# Patient Record
Sex: Female | Born: 1940 | Race: Black or African American | Hispanic: No | Smoking: Former smoker
Health system: Southern US, Community
[De-identification: ages and names within clinical notes are randomized; demographics above are authoritative.]

## PROBLEM LIST (undated history)

## (undated) DIAGNOSIS — E119 Type 2 diabetes mellitus without complications: Secondary | ICD-10-CM

## (undated) DIAGNOSIS — I639 Cerebral infarction, unspecified: Secondary | ICD-10-CM

## (undated) DIAGNOSIS — I10 Essential (primary) hypertension: Secondary | ICD-10-CM

## (undated) HISTORY — PX: JOINT REPLACEMENT: SHX530

---

## 2008-06-04 ENCOUNTER — Ambulatory Visit: Payer: Self-pay | Admitting: Orthopedic Surgery

## 2008-06-05 ENCOUNTER — Other Ambulatory Visit: Payer: Self-pay

## 2008-06-05 ENCOUNTER — Inpatient Hospital Stay: Payer: Self-pay | Admitting: Orthopedic Surgery

## 2014-02-26 ENCOUNTER — Inpatient Hospital Stay: Payer: Self-pay | Admitting: Specialist

## 2014-02-26 LAB — URINALYSIS, COMPLETE
BILIRUBIN, UR: NEGATIVE
Glucose,UR: >=500 mg/dL (ref 0–75)
Nitrite: NEGATIVE
PH: 5 (ref 4.5–8.0)
RBC,UR: 12 /HPF (ref 0–5)
Specific Gravity: 1.02 (ref 1.003–1.030)
Squamous Epithelial: <1
WBC UR: 87 /HPF (ref 0–5)

## 2014-02-26 LAB — CBC
HCT: 40.4 % (ref 35.0–47.0)
HGB: 13.4 g/dL (ref 12.0–16.0)
MCH: 30.6 pg (ref 26.0–34.0)
MCHC: 33.1 g/dL (ref 32.0–36.0)
MCV: 92 fL (ref 80–100)
PLATELETS: 136 10*3/uL — AB (ref 150–440)
RBC: 4.37 10*6/uL (ref 3.80–5.20)
RDW: 14.1 % (ref 11.5–14.5)
WBC: 4.2 10*3/uL (ref 3.6–11.0)

## 2014-02-26 LAB — COMPREHENSIVE METABOLIC PANEL
ALK PHOS: 87 U/L
ANION GAP: 7 (ref 7–16)
Albumin: 3.6 g/dL (ref 3.4–5.0)
BUN: 12 mg/dL (ref 7–18)
Bilirubin,Total: 0.3 mg/dL (ref 0.2–1.0)
Calcium, Total: 9 mg/dL (ref 8.5–10.1)
Chloride: 103 mmol/L (ref 98–107)
Co2: 24 mmol/L (ref 21–32)
Creatinine: 1.25 mg/dL (ref 0.60–1.30)
EGFR (African American): 49 — ABNORMAL LOW
GFR CALC NON AF AMER: 42 — AB
Glucose: 317 mg/dL — ABNORMAL HIGH (ref 65–99)
OSMOLALITY: 280 (ref 275–301)
POTASSIUM: 4.1 mmol/L (ref 3.5–5.1)
SGOT(AST): 35 U/L (ref 15–37)
SGPT (ALT): 19 U/L (ref 12–78)
Sodium: 134 mmol/L — ABNORMAL LOW (ref 136–145)
Total Protein: 7.3 g/dL (ref 6.4–8.2)

## 2014-02-26 LAB — PROTIME-INR
INR: 1.1
Prothrombin Time: 14.2 secs (ref 11.5–14.7)

## 2014-02-26 LAB — TROPONIN I: Troponin-I: <0.02 ng/mL

## 2014-02-26 LAB — MAGNESIUM: Magnesium: 1.5 mg/dL — ABNORMAL LOW

## 2014-02-26 IMAGING — CR DG CHEST 1V PORT
1 series · 1 of 1 positions shown · non-contrast
Comparison: [DATE]

CLINICAL DATA: Cough and chest congestion.  Sepsis.

EXAM:
PORTABLE CHEST - 1 VIEW

[ap]
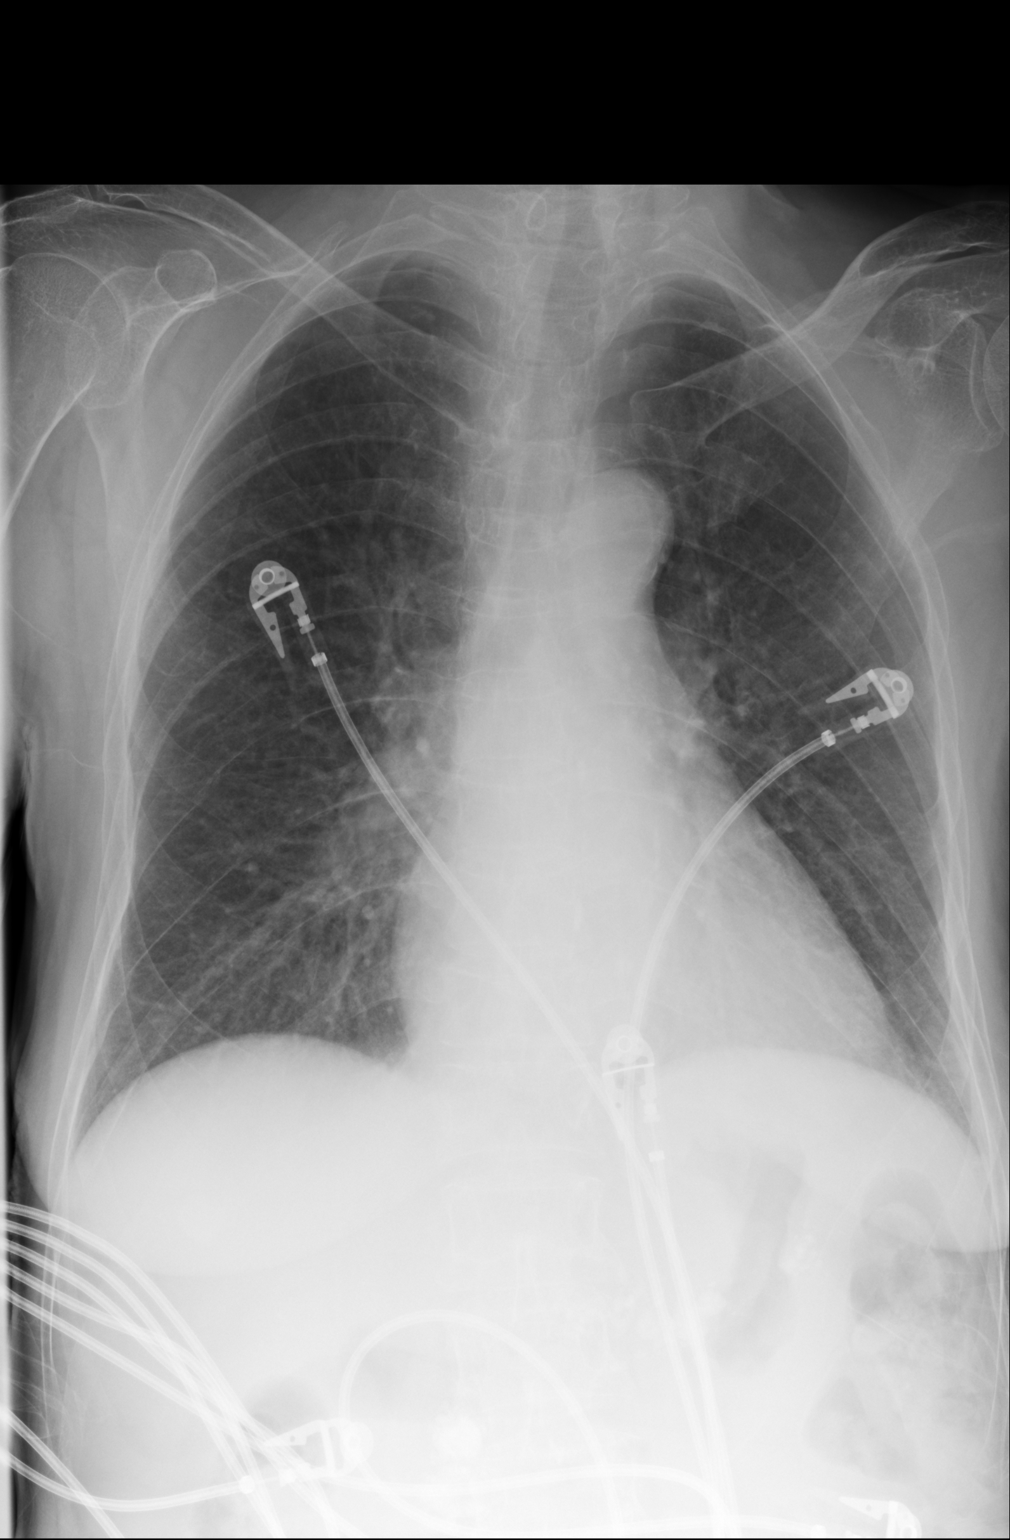

[1 of 1 positions shown; findings below may reference images not displayed]

FINDINGS: Heart size and pulmonary vascularity are normal and the lungs are
clear. No acute osseous abnormality.
IMPRESSION: No acute abnormalities.

## 2014-02-28 LAB — CSF CELL CT + PROT + GLU PANEL
CSF Tube #: 3
Eosinophil: 0 %
Glucose, CSF: 91 mg/dL — ABNORMAL HIGH (ref 40–75)
Lymphocytes: 0 %
Monocytes/Macrophages: 0 %
Neutrophils: 0 %
Other Cells: 0 %
PROTEIN, CSF: 34 mg/dL (ref 15–45)
RBC (CSF): 0 /mm3
WBC (CSF): 0 /mm3

## 2014-02-28 LAB — CBC WITH DIFFERENTIAL/PLATELET
BASOS PCT: 0.6 %
Basophil #: 0 10*3/uL (ref 0.0–0.1)
EOS PCT: 0.1 %
Eosinophil #: 0 10*3/uL (ref 0.0–0.7)
HCT: 34.4 % — ABNORMAL LOW (ref 35.0–47.0)
HGB: 11.5 g/dL — ABNORMAL LOW (ref 12.0–16.0)
LYMPHS ABS: 0.4 10*3/uL — AB (ref 1.0–3.6)
LYMPHS PCT: 11.6 %
MCH: 30.6 pg (ref 26.0–34.0)
MCHC: 33.3 g/dL (ref 32.0–36.0)
MCV: 92 fL (ref 80–100)
Monocyte #: 0.1 x10 3/mm — ABNORMAL LOW (ref 0.2–0.9)
Monocyte %: 2.6 %
Neutrophil #: 2.9 10*3/uL (ref 1.4–6.5)
Neutrophil %: 85.1 %
Platelet: 74 10*3/uL — ABNORMAL LOW (ref 150–440)
RBC: 3.75 10*6/uL — ABNORMAL LOW (ref 3.80–5.20)
RDW: 13.7 % (ref 11.5–14.5)
WBC: 3.4 10*3/uL — AB (ref 3.6–11.0)

## 2014-02-28 LAB — SODIUM: SODIUM: 135 mmol/L — AB (ref 136–145)

## 2014-02-28 LAB — URINE CULTURE

## 2014-02-28 LAB — RAPID INFLUENZA A&B ANTIGENS

## 2014-03-01 LAB — CBC WITH DIFFERENTIAL/PLATELET
Basophil #: 0 10*3/uL (ref 0.0–0.1)
Basophil %: 0.5 %
Eosinophil #: 0 10*3/uL (ref 0.0–0.7)
Eosinophil %: 0 %
HCT: 36.2 % (ref 35.0–47.0)
HGB: 11.8 g/dL — ABNORMAL LOW (ref 12.0–16.0)
LYMPHS ABS: 1 10*3/uL (ref 1.0–3.6)
Lymphocyte %: 29.2 %
MCH: 29.8 pg (ref 26.0–34.0)
MCHC: 32.6 g/dL (ref 32.0–36.0)
MCV: 92 fL (ref 80–100)
Monocyte #: 0.2 x10 3/mm (ref 0.2–0.9)
Monocyte %: 6.9 %
Neutrophil #: 2.3 10*3/uL (ref 1.4–6.5)
Neutrophil %: 63.4 %
Platelet: 50 10*3/uL — ABNORMAL LOW (ref 150–440)
RBC: 3.96 10*6/uL (ref 3.80–5.20)
RDW: 13.9 % (ref 11.5–14.5)
WBC: 3.6 10*3/uL (ref 3.6–11.0)

## 2014-03-01 LAB — URINE CULTURE

## 2014-03-02 LAB — BASIC METABOLIC PANEL
Anion Gap: 6 — ABNORMAL LOW (ref 7–16)
BUN: 9 mg/dL (ref 7–18)
CREATININE: 0.84 mg/dL (ref 0.60–1.30)
Calcium, Total: 8 mg/dL — ABNORMAL LOW (ref 8.5–10.1)
Chloride: 108 mmol/L — ABNORMAL HIGH (ref 98–107)
Co2: 26 mmol/L (ref 21–32)
EGFR (Non-African Amer.): >60
GLUCOSE: 73 mg/dL (ref 65–99)
Osmolality: 277 (ref 275–301)
POTASSIUM: 3 mmol/L — AB (ref 3.5–5.1)
Sodium: 140 mmol/L (ref 136–145)

## 2014-03-03 LAB — CULTURE, BLOOD (SINGLE)

## 2014-03-03 LAB — CBC WITH DIFFERENTIAL/PLATELET
Bands: 1 %
HCT: 33.5 % — AB (ref 35.0–47.0)
HGB: 10.9 g/dL — ABNORMAL LOW (ref 12.0–16.0)
Lymphocytes: 48 %
MCH: 29.5 pg (ref 26.0–34.0)
MCHC: 32.6 g/dL (ref 32.0–36.0)
MCV: 91 fL (ref 80–100)
Monocytes: 8 %
PLATELETS: 57 10*3/uL — AB (ref 150–440)
RBC: 3.7 10*6/uL — AB (ref 3.80–5.20)
RDW: 14.5 % (ref 11.5–14.5)
Segmented Neutrophils: 40 %
Variant Lymphocyte - H1-Rlymph: 3 %
WBC: 5.8 10*3/uL (ref 3.6–11.0)

## 2014-03-03 LAB — STOOL CULTURE

## 2014-03-03 LAB — CSF CULTURE W GRAM STAIN

## 2014-03-03 LAB — POTASSIUM: Potassium: 2.9 mmol/L — ABNORMAL LOW (ref 3.5–5.1)

## 2015-01-29 NOTE — Consult Note (Signed)
PATIENT NAME:  Kristin Mercer, Kristin Mercer MR#:  [id number] DATE OF BIRTH:  [birth year]  DATE OF CONSULTATION:  02/28/2014  REFERRING PHYSICIAN:  Katharina Caperima Vaickute, MD CONSULTING PHYSICIAN:  Stann Mainlandavid P. Sampson GoonFitzgerald, MD  REASON FOR CONSULTATION:  Fever and altered mental status.    HISTORY OF PRESENT ILLNESS:  This is a 74 year old female, relatively healthy, lives with her sons. She was admitted with several days of fatigue, weakness and excessive sleepiness. Her sons are in the room with her. She took poor p.o. intake as well. In the Emergency Room, she was found to have a low-grade temp and was admitted with possible urosepsis. She has been started on antibiotics, however, she continues to have high temperatures and remains altered. Her son says she has never been like this before. She not sure where she is, she is not interacting in her usual manner. The son denies that she had any cough, complaints of headache, abdominal pain, nausea, vomiting, diarrhea or dysuria prior. She does go out and walk in the yard with her sons.  They do have pet dogs at home.   PAST MEDICAL HISTORY: 1.  Diabetes, follows with Dr. Tedd SiasSolum, endocrinology.  2.  Hypertension.   SOCIAL HISTORY: She lives with her sons at home. She does continue to smoke but does not drink any alcohol.   FAMILY HISTORY:  Positive for hypertension.   ALLERGIES: No known drug allergies.   MEDICATIONS: As an outpatient she takes metoprolol, glipizide, ferrous sulfate and calcium.   REVIEW OF SYSTEMS: Unable to be obtained as the patient is altered.   ANTIBIOTICS SINCE ADMISSION: Include ceftriaxone and levofloxacin. She has also been started on doxycycline today.   PHYSICAL EXAMINATION:  VITAL SIGNS:  The patient has been febrile since admission to 102.8, pulse 95, blood pressure 115/66, respirations 16, sat 94% on room air.  GENERAL: She is laying in bed, she is ill appearing. She is not very interactive but is able to respond to yes or no questions.   HEENT:  Her pupils are equal, round, reactive to light and accommodation. Extraocular movements are intact. Sclerae anicteric. Oropharynx clear.  Mucous membranes are somewhat dry.  NECK: Supple. No anterior cervical, posterior cervical or supraclavicular lymphadenopathy.  HEART: Regular.  LUNGS: Clear.  ABDOMEN: Soft, nontender. No CVA tenderness.  EXTREMITIES: No clubbing, cyanosis or edema.  SKIN: She has no obvious rash.  MUSCULOSKELETAL: No obvious joint pain or swelling.  NEUROLOGIC: She is confused about where she is. She is able to move all 4 extremities but is not cooperative with the neuro exam.   LABORATORY, DIAGNOSTIC AND RADIOLOGICAL DATA:  White count 3.4, hemoglobin 11.5, platelets are pending but on admission they were 136. Differential shows, lymphocyte count of 0.4, monocytes 0.1. INR was normal. Renal function showed normal renal panel but mild hyponatremia at 134, glucose 317. LFTs were normal. Troponins were negative. CT of the head was negative for any acute findings. Chest x-ray was also normal except for some segmental atelectasis.   IMPRESSION:  A 74 year old, relatively healthy with diabetes, hypertension, admitted with altered mental status and fevers. She does have a positive urinalysis but her urine culture and blood cultures are negative. She has been febrile since admission and altered despite antibiotics and fluids.  She has been started on doxycycline today and I had initially started her earlier on acyclovir. I had attempted a lumbar puncture but only really was able to get a bloody amount of cerebrospinal fluid.  She did have a lumbar puncture done  finally by interventional radiology that was very clear with no white cells, no red cells, normal protein, slightly elevated glucose.   RECOMMENDATIONS: 1.  Continue doxycycline which was started today and await RMSF serologies.  2.  I have added Zosyn in case she has a evidence of a bacterial infection although she  does not have a high white count and her urine and blood cultures were negative. We can potentially stop this in the next day or 2.  3.  I will stop her acyclovir as her CSF is quite clear.   Thank you for the consult. I will be glad to follow with you.   ____________________________ Stann Mainland. Sampson Goon, MD dpf:cs D: 02/28/2014 18:54:10 ET T: 02/28/2014 20:16:03 ET JOB#: [id number]  cc: Stann Mainland. Sampson Goon, MD, <Dictator> Kashon Kraynak Sampson Goon MD ELECTRONICALLY SIGNED 03/01/2014 21:01

## 2015-01-29 NOTE — Discharge Summary (Signed)
PATIENT NAME:  Kristin Mercer, Kristin Mercer MR#:  [id number] DATE OF BIRTH:  Jan 11, 1941  DATE OF ADMISSION:  02/26/2014 DATE OF DISCHARGE:  03/03/2014  For a detailed note, please take a look at the history and physical done on admission by Dr. Enedina FinnerSona Patel.   DIAGNOSES AT DISCHARGE: As follows:  1.  Systemic inflammatory response syndrome secondary to a tickborne illness, pancytopenia due to a tickborne illness, altered mental status secondary to a tickborne illness.  2.  COPD.  3.  Hypertension.  4.  Diabetes.  5.  Hypokalemia.   The patient is being discharged on a low-sodium diet.   ACTIVITY: As tolerated.   Follow up with Dr. Dario GuardianJadali in the next 1 to 2 weeks. The patient is being discharged with home health physical therapy services.   DISCHARGE MEDICATIONS: Calcium with vitamin D 1 tab daily, iron sulfate 325 mg t.i.d., glipizide 10 mg daily, metoprolol succinate 50 mg daily, doxycycline 100 mg b.i.d. x 4 days, potassium 20 mEq b.i.d. x 5 days and albuterol inhaler 2 puffs q.4-6 hours as needed for shortness of breath.   CONSULTANTS DURING THE HOSPITAL COURSE: Dr. Clydie Braunavid Fitzgerald from infectious disease.   PERTINENT STUDIES DONE DURING THE HOSPITAL COURSE: A chest x-ray done on admission showing no acute abnormalities. A repeat chest x-ray done on May 24th showing  new subsegmental left basal atelectasis. No consolidation. The patient's blood cultures, urine culture and stool culture noted to be essentially negative. The patient's CSF analysis for Lyme disease noted to be negative. [facility] IgM also negative. Ehrlichia antibody panel is still pending. Influenza A and B antigen negative. PCR for HSV is negative.   HOSPITAL COURSE: This is a 74 year old female with medical problems as mentioned above, presented to the hospital on 02/26/2014, due to generalized weakness, fatigue, cough and also a fever.  1.  Systemic inflammatory response syndrome. The patient presented to the  hospital with fever, tachycardia and lethargy. Initially, the etiology of this was unclear. The patient's acute infectious workup, like urinalysis and chest x-ray, were negative. There was some thought of a possible UTI; therefore, the patient was started on IV Zosyn empirically. The patient's mental status continued to deteriorate; therefore, there was some concern for possible tickborne illness, given her pancytopenia. The patient underwent a lumbar puncture, the analysis of which was not consistent with any acute bacterial meningitis, but there was some concern for possible underlying encephalitis; therefore, acyclovir was started, along with doxycycline. The patient's clinical symptoms have significantly improved. She has been afebrile now for the past 48 hours. Her weakness and her mental status have improved. The exact etiology of her systemic inflammatory response syndrome is unclear, but suspected to be secondary to a tickborne illness; therefore, the patient is empirically being discharged on oral doxycycline presently. The patient was seen by infectious disease, by Dr. Sampson GoonFitzgerald, who agreed with this management.  2.  Altered mental status. This was secondary to the underlying tickborne illness. The exact etiology of the tickborne illness is unclear presently. The patient did undergo lumbar puncture, but the spinal fluid studies are essentially negative. There was no evidence of encephalitis. The patient was empirically treated with acyclovir for about 48 hours, which was then discontinued. Her mental status is now back to baseline. She is somewhat weak; therefore, home health physical therapy is still being arranged for her.  3.  Suspected pneumonia. This was also thought to be a working diagnosis of the systemic inflammatory response syndrome, but has now  been ruled out. It was thought to be aspiration pneumonia; therefore, the patient was empirically on Zosyn. Currently, the patient is not being  treated for pneumonia. Her shortness of breath and wheezing that she has is secondary to underlying COPD and ongoing tobacco abuse; therefore, she is being discharged on some albuterol inhaler as needed.  4.  Hypertension. The patient remained hemodynamically stable. She will continue her Toprol.  5. Generalized weakness. This was secondary to the tickborne illness.  The patient was seen by physical therapy and she is being arranged for home health physical therapy.  6.  Pancytopenia. This was secondary to the tickborne illness. Her counts have improved and further needs to be followed as an outpatient.   The patient is a FULL CODE.   She is being discharged home with home health services.   TIME SPENT: 40 minutes.   ____________________________ Rolly Pancake. Cherlynn Kaiser, MD vjs:dmm D: 03/03/2014 14:42:01 ET T: 03/03/2014 20:54:33 ET JOB#: [id number]  cc: Rolly Pancake. Cherlynn Kaiser, MD, <Dictator> Marlyn Corporal, MD Houston Siren MD ELECTRONICALLY SIGNED 03/13/2014 21:46

## 2015-01-29 NOTE — Consult Note (Signed)
Brief Consult Note: Diagnosis: Fever, AMS, possible UTI.   Patient was seen by consultant.   Consult note dictated.   Recommend further assessment or treatment.   Orders entered.   Discussed with Attending MD.   Comments: Rec Started zosyn Cont doxycyline RMSF pending rec LP to eval for possible HSV encephalitis.  Electronic Signatures: Dierdre HarnessFitzgerald, Doha Boling Patrick (MD)  (Signed 24-May-15 13:41)  Authored: Brief Consult Note   Last Updated: 24-May-15 13:41 by Dierdre HarnessFitzgerald, Siennah Barrasso Patrick (MD)

## 2015-01-29 NOTE — H&P (Signed)
PATIENT NAME:  Kristin Mercer, Kristin Mercer MR#:  [id number] DATE OF BIRTH:  Mar 02, 1941  DATE OF ADMISSION:  02/26/2014  PRIMARY CARE PHYSICIAN:  The patient does not have anybody.  The son tells they just follow Dr. Tedd SiasSolum.  ENDOCRINOLOGIST:  Dr. Tedd SiasSolum.   CHIEF COMPLAINT: Generalized weakness and fatigue along with cough and congestion.  Kristin Mercer is a 74 year old female with past medical history of hypertension and diabetes who comes to the Emergency Room accompanied by her two sons with complaints of increased fatigability, weakness and excessive sleepiness for the last several days. She has been having a poor p.o. intake as well. She was found to have in the Emergency Room possible acute mild bronchitis with UTI. She came in with fever of 99.4, was tachycardic in the 100s. She is being admitted with sepsis secondary to UTI/acute mild bronchitis.   PAST MEDICAL HISTORY: 1.  Diabetes.  2.  Hypertension.   MEDICATIONS: 1.  Metoprolol succinate 50 mg p.o. daily.  2.  Glipizide 10 mg p.o. b.i.d.  3.  Ferrous sulfate 325 p.o. t.i.d.  4.  Calcium with vitamin D 1 tablet p.o. daily.   ALLERGIES: No known drug allergies.   SOCIAL HISTORY: Lives with her son at home. Smokes about 10 cigarettes a day. She is a smoker for many years. Denies any alcohol use.  FAMILY HISTORY: Positive for hypertension.  REVIEW OF SYSTEMS:  CONSTITUTIONAL: Positive for fever, fatigue, weakness.  EYES: No blurred or double vision, glaucoma or cataracts.  ENT: No tinnitus, ear pain, hearing loss or epistaxis.  RESPIRATORY:  Positive for cough. No hemoptysis, dyspnea or COPD.  CARDIOVASCULAR: No chest pain, orthopnea, edema or dyspnea on exertion.  GASTROINTESTINAL: No nausea, vomiting, diarrhea, abdominal pain or GERD.  GENITOURINARY: No dysuria, hematuria or frequency.  ENDOCRINE: No polyuria, nocturia or thyroid problems.  HEMATOLOGY: No anemia or easy bruising.  SKIN: No acne, rash or lesion.  MUSCULOSKELETAL:  Positive for arthritis. No gout or swelling. NEUROLOGIC: No CVA, TIA, ataxia or dysarthria.  PSYCHIATRIC: No anxiety or depression.  No bipolar disorder. All other system reviews are negative.  PHYSICAL EXAMINATION: GENERAL: The patient is awake, alert, oriented x 3. She appears to be somewhat slow when answering questions.  VITAL SIGNS: Temperature is 99.4. Pulse is 90. Blood pressure is 161/67. Sats are 96% on room air.  HEENT: Atraumatic, normocephalic. Pupils: PERRLA. EOM intact. Oral mucosa is moist.  NECK: Supple. No JVD. No carotid bruit.  RESPIRATORY: Clear to auscultation bilaterally. There is decreased breath sounds in the bases. No respiratory distress or labored breathing.  CARDIOVASCULAR: Both the heart sounds are normal. Tachycardia present. No murmur heard. PMI not lateralized. Chest nontender.  EXTREMITIES: Good pedal pulses. Good femoral pulses. No lower extremity edema.  ABDOMEN: Soft, benign, nontender. No organomegaly. Positive bowel sounds.  NEUROLOGIC: Grossly intact cranial nerves II through XII. No motor or sensory deficit.  PSYCHIATRIC: The patient is awake, alert, oriented x 3. The patient is awake, alert and oriented x 3. SKIN: Warm and dry.   Chest x-ray: No acute cardiopulmonary abnormality.  UA positive for UTI. Glucose is 317, BUN 12, creatinine 1.25. Sodium is 134. Potassium is 4.1. Chloride is 103. Bicarb is 24. LFTs within normal limits. Magnesium is 1.5. Basic metabolic panel within normal limits., PO2 of 62, pH of 7.44, pCO2 is 31.   ASSESSMENT AND PLAN: A 74 year old, Kristin Mercer, with history of hypertension, diabetes, comes in with cough, congestion, increasing weakness, found to be in: 1.  Sepsis.  Early sepsis present on admission likely secondary to urinary tract infection and possible acute mild bronchitis. Chest x-ray is negative for pneumonia. Urinalysis is abnormal. We will give broad-spectrum antibiotics with IV Levaquin. Follow blood cultures,  urine cultures. Changed to oral Levaquin in a day or two. Continue IV fluids for hydration. Tylenol as needed for fever.  2.  Mild hypoxia suspected due to cough, congestion and possible acute bronchitis. The patient does not appear to be wheezing or having excessive shortness of breath. We will hold off on IV steroids. Continue DuoNeb every 6 hours while awake as needed. The patient is not on any inhalers at home.  3.  Tobacco abuse. The patient was counseled about 4 minutes for smoking cessation. She tells me she smoked all her life and does not know if she is going to be able to quit smoking.  4.  Noncontrolled, diabetes. We will resume her glipizide, put her on sliding scale insulin, adjust dosage of glipizide if needed.  5.  Hypertension. Continue metoprolol.  6.  Deep vein thrombosis prophylaxis. Will give subcutaneous heparin.  7.  Hypomagnesemia. Replete with oral magnesium.    Above was discussed with patient's son who was present in the Emergency Room.  TIME SPENT: 50 minutes    ____________________________ Jearl Klinefelter A. Allena Katz, MD sap:ce D: 02/26/2014 19:44:05 ET T: 02/26/2014 20:31:15 ET JOB#: [id number]  cc: Toddy Boyd A. Allena Katz, MD, <Dictator> Willow Ora MD ELECTRONICALLY SIGNED 03/01/2014 14:36

## 2017-01-01 ENCOUNTER — Emergency Department: Payer: Medicare HMO

## 2017-01-01 ENCOUNTER — Inpatient Hospital Stay
Admission: EM | Admit: 2017-01-01 | Discharge: 2017-01-05 | DRG: 100 | Disposition: A | Discharge status: Home / Self Care | Payer: Medicare HMO | Attending: Internal Medicine | Admitting: Internal Medicine

## 2017-01-01 ENCOUNTER — Inpatient Hospital Stay: Payer: Medicare HMO

## 2017-01-01 DIAGNOSIS — Z681 Body mass index (BMI) 19 or less, adult: Secondary | ICD-10-CM | POA: Diagnosis not present

## 2017-01-01 DIAGNOSIS — Z794 Long term (current) use of insulin: Secondary | ICD-10-CM

## 2017-01-01 DIAGNOSIS — Z8781 Personal history of (healed) traumatic fracture: Secondary | ICD-10-CM

## 2017-01-01 DIAGNOSIS — E1101 Type 2 diabetes mellitus with hyperosmolarity with coma: Secondary | ICD-10-CM | POA: Diagnosis present

## 2017-01-01 DIAGNOSIS — F17200 Nicotine dependence, unspecified, uncomplicated: Secondary | ICD-10-CM | POA: Diagnosis present

## 2017-01-01 DIAGNOSIS — Z8673 Personal history of transient ischemic attack (TIA), and cerebral infarction without residual deficits: Secondary | ICD-10-CM

## 2017-01-01 DIAGNOSIS — E43 Unspecified severe protein-calorie malnutrition: Secondary | ICD-10-CM | POA: Diagnosis present

## 2017-01-01 DIAGNOSIS — Z66 Do not resuscitate: Secondary | ICD-10-CM | POA: Diagnosis present

## 2017-01-01 DIAGNOSIS — R569 Unspecified convulsions: Secondary | ICD-10-CM

## 2017-01-01 DIAGNOSIS — G9341 Metabolic encephalopathy: Secondary | ICD-10-CM | POA: Diagnosis present

## 2017-01-01 DIAGNOSIS — E118 Type 2 diabetes mellitus with unspecified complications: Secondary | ICD-10-CM

## 2017-01-01 DIAGNOSIS — I119 Hypertensive heart disease without heart failure: Secondary | ICD-10-CM | POA: Diagnosis present

## 2017-01-01 DIAGNOSIS — G931 Anoxic brain damage, not elsewhere classified: Secondary | ICD-10-CM | POA: Diagnosis present

## 2017-01-01 DIAGNOSIS — G40901 Epilepsy, unspecified, not intractable, with status epilepticus: Principal | ICD-10-CM | POA: Diagnosis present

## 2017-01-01 DIAGNOSIS — L8990 Pressure ulcer of unspecified site, unspecified stage: Secondary | ICD-10-CM | POA: Diagnosis present

## 2017-01-01 DIAGNOSIS — E1151 Type 2 diabetes mellitus with diabetic peripheral angiopathy without gangrene: Secondary | ICD-10-CM | POA: Diagnosis present

## 2017-01-01 DIAGNOSIS — Z7984 Long term (current) use of oral hypoglycemic drugs: Secondary | ICD-10-CM

## 2017-01-01 DIAGNOSIS — Z7401 Bed confinement status: Secondary | ICD-10-CM

## 2017-01-01 DIAGNOSIS — R000 Tachycardia, unspecified: Secondary | ICD-10-CM | POA: Diagnosis present

## 2017-01-01 DIAGNOSIS — N179 Acute kidney failure, unspecified: Secondary | ICD-10-CM

## 2017-01-01 DIAGNOSIS — R739 Hyperglycemia, unspecified: Secondary | ICD-10-CM

## 2017-01-01 DIAGNOSIS — R401 Stupor: Secondary | ICD-10-CM | POA: Diagnosis not present

## 2017-01-01 HISTORY — DX: Type 2 diabetes mellitus without complications: E11.9

## 2017-01-01 HISTORY — DX: Cerebral infarction, unspecified: I63.9

## 2017-01-01 HISTORY — DX: Essential (primary) hypertension: I10

## 2017-01-01 LAB — URINALYSIS, COMPLETE (UACMP) WITH MICROSCOPIC
BACTERIA UA: NONE SEEN
Bilirubin Urine: NEGATIVE
Glucose, UA: >=500 mg/dL — AB
HGB URINE DIPSTICK: NEGATIVE
Ketones, ur: NEGATIVE mg/dL
Leukocytes, UA: NEGATIVE
NITRITE: NEGATIVE
PH: 5 (ref 5.0–8.0)
Protein, ur: NEGATIVE mg/dL
SPECIFIC GRAVITY, URINE: 1.017 (ref 1.005–1.030)

## 2017-01-01 LAB — URINE DRUG SCREEN, QUALITATIVE (ARMC ONLY)
AMPHETAMINES, UR SCREEN: NOT DETECTED
Barbiturates, Ur Screen: NOT DETECTED
Benzodiazepine, Ur Scrn: NOT DETECTED
COCAINE METABOLITE, UR ~~LOC~~: NOT DETECTED
Cannabinoid 50 Ng, Ur ~~LOC~~: NOT DETECTED
MDMA (ECSTASY) UR SCREEN: NOT DETECTED
Methadone Scn, Ur: NOT DETECTED
Opiate, Ur Screen: NOT DETECTED
Phencyclidine (PCP) Ur S: NOT DETECTED
TRICYCLIC, UR SCREEN: NOT DETECTED

## 2017-01-01 LAB — TROPONIN I: Troponin I: 0.04 ng/mL (ref <0.03)

## 2017-01-01 LAB — GLUCOSE, CAPILLARY
GLUCOSE-CAPILLARY: 115 mg/dL — AB (ref 65–99)
GLUCOSE-CAPILLARY: 162 mg/dL — AB (ref 65–99)
GLUCOSE-CAPILLARY: 197 mg/dL — AB (ref 65–99)
Glucose-Capillary: >600 mg/dL (ref 65–99)
Glucose-Capillary: 543 mg/dL (ref 65–99)
Glucose-Capillary: 399 mg/dL — ABNORMAL HIGH (ref 65–99)
Glucose-Capillary: 310 mg/dL — ABNORMAL HIGH (ref 65–99)
Glucose-Capillary: 119 mg/dL — ABNORMAL HIGH (ref 65–99)
Glucose-Capillary: 107 mg/dL — ABNORMAL HIGH (ref 65–99)
Glucose-Capillary: 111 mg/dL — ABNORMAL HIGH (ref 65–99)

## 2017-01-01 LAB — BLOOD GAS, VENOUS
PCO2 VEN: 19 mmHg — AB (ref 44.0–60.0)
PH VEN: 7.39 (ref 7.250–7.430)
Patient temperature: 37
pO2, Ven: 225 mmHg — ABNORMAL HIGH (ref 32.0–45.0)

## 2017-01-01 LAB — CBC WITH DIFFERENTIAL/PLATELET
Basophils Absolute: 0.1 10*3/uL (ref 0–0.1)
Basophils Relative: 1 %
Eosinophils Absolute: 0 10*3/uL (ref 0–0.7)
Eosinophils Relative: 0 %
HEMATOCRIT: 35.7 % (ref 35.0–47.0)
HEMOGLOBIN: 11.6 g/dL — AB (ref 12.0–16.0)
LYMPHS ABS: 1 10*3/uL (ref 1.0–3.6)
LYMPHS PCT: 8 %
MCH: 30.7 pg (ref 26.0–34.0)
MCHC: 32.6 g/dL (ref 32.0–36.0)
MCV: 94.2 fL (ref 80.0–100.0)
MONOS PCT: 4 %
Monocytes Absolute: 0.6 10*3/uL (ref 0.2–0.9)
NEUTROS ABS: 11.8 10*3/uL — AB (ref 1.4–6.5)
NEUTROS PCT: 87 %
Platelets: 170 10*3/uL (ref 150–440)
RBC: 3.79 MIL/uL — AB (ref 3.80–5.20)
RDW: 13.9 % (ref 11.5–14.5)
WBC: 13.6 10*3/uL — AB (ref 3.6–11.0)

## 2017-01-01 LAB — MRSA PCR SCREENING: MRSA by PCR: NEGATIVE

## 2017-01-01 LAB — COMPREHENSIVE METABOLIC PANEL
ALK PHOS: 95 U/L (ref 38–126)
ALT: 40 U/L (ref 14–54)
AST: 32 U/L (ref 15–41)
Albumin: 4 g/dL (ref 3.5–5.0)
Anion gap: 12 (ref 5–15)
BILIRUBIN TOTAL: 0.5 mg/dL (ref 0.3–1.2)
BUN: 66 mg/dL — ABNORMAL HIGH (ref 6–20)
CO2: 20 mmol/L — AB (ref 22–32)
CREATININE: 2.55 mg/dL — AB (ref 0.44–1.00)
Calcium: 9.3 mg/dL (ref 8.9–10.3)
Chloride: 105 mmol/L (ref 101–111)
GFR calc non Af Amer: 17 mL/min — ABNORMAL LOW (ref >60)
GFR, EST AFRICAN AMERICAN: 20 mL/min — AB (ref >60)
Glucose, Bld: 718 mg/dL (ref 65–99)
Potassium: 4.9 mmol/L (ref 3.5–5.1)
Sodium: 137 mmol/L (ref 135–145)
TOTAL PROTEIN: 7.5 g/dL (ref 6.5–8.1)

## 2017-01-01 LAB — LACTIC ACID, PLASMA: Lactic Acid, Venous: 3.5 mmol/L (ref 0.5–1.9)

## 2017-01-01 LAB — ETHANOL

## 2017-01-01 MED ORDER — SODIUM CHLORIDE 0.45 % IV SOLN
INTRAVENOUS | Status: DC
  Administered 2017-01-01 – 2017-01-02 (×2): via INTRAVENOUS

## 2017-01-01 MED ORDER — HEPARIN SODIUM (PORCINE) 5000 UNIT/ML IJ SOLN
5000.0000 [IU] | Freq: Two times a day (BID) | INTRAMUSCULAR | Status: DC
  Administered 2017-01-01 – 2017-01-04 (×7): 5000 [IU] via SUBCUTANEOUS
  Filled 2017-01-01 (×7): qty 1

## 2017-01-01 MED ORDER — ACETAMINOPHEN 500 MG PO TABS: 500.0000 mg | ORAL_TABLET | ORAL | Status: DC | PRN

## 2017-01-01 MED ORDER — SODIUM CHLORIDE 0.9 % IV SOLN
INTRAVENOUS | Status: DC
  Administered 2017-01-01: 5.4 [IU]/h via INTRAVENOUS
  Administered 2017-01-01: 9.7 [IU]/h via INTRAVENOUS
  Filled 2017-01-01: qty 2.5

## 2017-01-01 MED ORDER — SODIUM CHLORIDE 0.9 % IV SOLN
INTRAVENOUS | Status: DC
  Administered 2017-01-01: 3.4 [IU]/h via INTRAVENOUS
  Administered 2017-01-01: 2.5 [IU]/h via INTRAVENOUS
  Administered 2017-01-01: 1.4 [IU]/h via INTRAVENOUS

## 2017-01-01 MED ORDER — DEXTROSE 50 % IV SOLN: 25.0000 mL | INTRAVENOUS | Status: DC | PRN

## 2017-01-01 MED ORDER — SODIUM CHLORIDE 0.9 % IV SOLN
500.0000 mg | Freq: Two times a day (BID) | INTRAVENOUS | Status: DC
  Administered 2017-01-01: 500 mg via INTRAVENOUS
  Filled 2017-01-01 (×3): qty 5

## 2017-01-01 MED ORDER — ORAL CARE MOUTH RINSE
15.0000 mL | Freq: Two times a day (BID) | OROMUCOSAL | Status: DC
  Administered 2017-01-01: 15 mL via OROMUCOSAL

## 2017-01-01 MED ORDER — SODIUM CHLORIDE 0.9 % IV SOLN
1000.0000 mg | Freq: Once | INTRAVENOUS | Status: AC
  Administered 2017-01-01: 1000 mg via INTRAVENOUS
  Filled 2017-01-01: qty 10

## 2017-01-01 MED ORDER — INSULIN ASPART 100 UNIT/ML ~~LOC~~ SOLN
2.0000 [IU] | SUBCUTANEOUS | Status: DC
  Administered 2017-01-02: 4 [IU] via SUBCUTANEOUS
  Filled 2017-01-01: qty 4

## 2017-01-01 MED ORDER — SODIUM CHLORIDE 0.9 % IV SOLN
Freq: Once | INTRAVENOUS | Status: AC
  Administered 2017-01-01: 09:00:00 via INTRAVENOUS

## 2017-01-01 MED ORDER — SODIUM CHLORIDE 0.9 % IV SOLN: 500.0000 mg | Freq: Two times a day (BID) | INTRAVENOUS | Status: DC

## 2017-01-01 MED ORDER — ONDANSETRON HCL 4 MG/2ML IJ SOLN: 4.0000 mg | Freq: Four times a day (QID) | INTRAMUSCULAR | Status: DC | PRN

## 2017-01-01 MED ORDER — LORAZEPAM 2 MG/ML IJ SOLN
2.0000 mg | Freq: Once | INTRAMUSCULAR | Status: AC
  Administered 2017-01-01: 2 mg via INTRAVENOUS

## 2017-01-01 MED ORDER — ACETAMINOPHEN 650 MG RE SUPP: 650.0000 mg | RECTAL | Status: DC | PRN

## 2017-01-01 NOTE — Progress Notes (Signed)
Per [facility], Dr. Belia HemanKasa ; continue to check blood sugars for a total of 6 blood sugars post drip continuation. Once completed, patient to be transitioned to Sliding Scale (sensitive) with q4 hours blood glucose checks.

## 2017-01-01 NOTE — Consult Note (Signed)
Reason for Consult:Status epilepticus Referring Physician: Sung Amabile  CC: Seizures  HPI: Kristin Mercer is an 76 y.o. female who due to mental status is unable to provide history.  No family available.  Patient with no previous history of seizures or strokes per the chart.  Patient presented in what appeared to be status epileptics with what is described as "rhythmic contractions and unresponsiveness".  Patient given Ativan and loaded with Keppra.  No further contractions noted.  Patient has not regained consciousness.    Past medical history: Brain lesion, HTN, DM  Past surgical history: ORIF left hip repair for fracture  Family history: Unknown  Social History:  Patient is a smoker with no history of ETOH or illicit drug abuse.    Allergies: NKDA  Medications: I have reviewed the patient's current medications. Prior to Admission:  Prior to Admission medications   Medication Sig Start Date End Date Taking? Authorizing Provider  glipiZIDE (GLUCOTROL) 5 MG tablet Take 2 tablets by mouth 2 (two) times daily. 10/09/16  Yes Historical Provider, MD  lisinopril (PRINIVIL,ZESTRIL) 10 MG tablet Take 10 mg by mouth daily.   Yes Historical Provider, MD  metFORMIN (GLUCOPHAGE) 1000 MG tablet Take 1,000 mg by mouth 2 (two) times daily with a meal.   Yes Historical Provider, MD  sitaGLIPtin (JANUVIA) 100 MG tablet Take 100 mg by mouth daily.   Yes Historical Provider, MD     ROS: Unable to obtain  Physical Examination: Blood pressure 140/79, pulse 96, temperature 99.1 F (37.3 C), temperature source Rectal, resp. rate 14, height 4\' 11"  (1.499 m), weight 34 kg (75 lb), SpO2 99 %.  HEENT-  Normocephalic, no lesions, without obvious abnormality.  Normal external eye and conjunctiva.  Normal TM's bilaterally.  Normal auditory canals and external ears. Normal external nose, mucus membranes and septum.  Normal pharynx. Cardiovascular- S1, S2 normal, pulses palpable throughout   Lungs- chest clear, no  wheezing, rales, normal symmetric air entry Abdomen- soft, non-tender; bowel sounds normal; no masses,  no organomegaly Extremities- no edema Lymph-no adenopathy palpable Musculoskeletal-no joint tenderness, deformity or swelling Skin-warm and dry, no hyperpigmentation, vitiligo, or suspicious lesions  Neurological Examination   Mental Status: Patient does not respond to verbal stimuli.  Localizes with LUE to deep sternal rub.  Does not follow commands.  No verbalizations noted.  Cranial Nerves: II: patient does not respond confrontation bilaterally, pupils right 2 mm, left 3 mm,and unreactive bilaterally III,IV,VI: doll's response present bilaterally.  V,VII: corneal reflex reduced bilaterally, right facial droop  VIII: patient does not respond to verbal stimuli IX,X: gag reflex reduced, XI: trapezius strength unable to test bilaterally XII: tongue strength unable to test Motor: Extremities flaccid throughout.   Sensory: Grimaces to noxious stimuli in the upper extremities but not in the lower extremities. Deep Tendon Reflexes:  2+ in the upper extremities and the right lower extremity.  1+ left KJ, absent left AJ Plantars: upgoing on the right Cerebellar: Unable to perform    Laboratory Studies:   Basic Metabolic Panel:  Recent Labs Lab 01/01/17 0845  NA 137  K 4.9  CL 105  CO2 20*  GLUCOSE 718*  BUN 66*  CREATININE 2.55*  CALCIUM 9.3    Liver Function Tests:  Recent Labs Lab 01/01/17 0845  AST 32  ALT 40  ALKPHOS 95  BILITOT 0.5  PROT 7.5  ALBUMIN 4.0   No results for input(s): LIPASE, AMYLASE in the last 168 hours. No results for input(s): AMMONIA in the last 168  hours.  CBC:  Recent Labs Lab 01/01/17 0845  WBC 13.6*  NEUTROABS 11.8*  HGB 11.6*  HCT 35.7  MCV 94.2  PLT 170    Cardiac Enzymes:  Recent Labs Lab 01/01/17 0845  TROPONINI 0.04*    BNP: Invalid input(s): POCBNP  CBG:  Recent Labs Lab 01/01/17 0846 01/01/17 1054   GLUCAP >600* 543*    Microbiology: Results for orders placed or performed in visit on 02/26/14  Culture, blood (single)     Status: None   Collection Time: 02/26/14  6:29 PM  Result Value Ref Range Status   Micro Text Report   Final       COMMENT                   NO GROWTH AEROBICALLY/ANAEROBICALLY IN 5 DAYS   ANTIBIOTIC                                                      Culture, blood (single)     Status: None   Collection Time: 02/26/14  6:29 PM  Result Value Ref Range Status   Micro Text Report   Final       COMMENT                   NO GROWTH AEROBICALLY/ANAEROBICALLY IN 5 DAYS   ANTIBIOTIC                                                      Urine culture     Status: None   Collection Time: 02/26/14  6:30 PM  Result Value Ref Range Status   Micro Text Report   Final       SOURCE: CLEAN CATCH    COMMENT                   NO GROWTH IN 36 HOURS   ANTIBIOTIC                                                      Influenza A&B Antigens [facility])     Status: None   Collection Time: 02/28/14  8:24 AM  Result Value Ref Range Status   Micro Text Report   Final       COMMENT                   NEGATIVE FOR INFLUENZA A (ANTIGEN ABSENT)   COMMENT                   NEGATIVE FOR INFLUENZA B (ANTIGEN ABSENT)   ANTIBIOTIC                                                      Urine culture     Status: None   Collection Time: 02/28/14  8:24 AM  Result  Value Ref Range Status   Micro Text Report   Final       SOURCE: please check for lea    COMMENT                   NO GROWTH IN 18-24 HOURS   ANTIBIOTIC                                                      CSF culture     Status: None   Collection Time: 02/28/14  3:00 PM  Result Value Ref Range Status   Micro Text Report   Final       COMMENT                   NO GROWTH IN 3 DAYS   GRAM STAIN                FEW WHITE BLOOD CELLS   GRAM STAIN                RARE RED BLOOD CELLS   GRAM STAIN                NO ORGANISMS  SEEN   ANTIBIOTIC                                                      Stool culture     Status: None   Collection Time: 03/01/14  7:45 AM  Result Value Ref Range Status   Micro Text Report   Final       COMMENT                   NO SALMONELLA OR SHIGELLA ISOLATED   COMMENT                   NO PATHOGENIC E.COLI DETECTED   COMMENT                   NO CAMPYLOBACTER ANTIGEN DETECTED   ANTIBIOTIC                                                        Coagulation Studies: No results for input(s): LABPROT, INR in the last 72 hours.  Urinalysis:  Recent Labs Lab 01/01/17 0845  COLORURINE YELLOW*  LABSPEC 1.017  PHURINE 5.0  GLUCOSEU >=500*  HGBUR NEGATIVE  BILIRUBINUR NEGATIVE  KETONESUR NEGATIVE  PROTEINUR NEGATIVE  NITRITE NEGATIVE  LEUKOCYTESUR NEGATIVE    Lipid Panel:  No results found for: CHOL, TRIG, HDL, CHOLHDL, VLDL, LDLCALC  HgbA1C: No results found for: HGBA1C  Urine Drug Screen:     Component Value Date/Time   LABOPIA NONE DETECTED 01/01/2017 0845   COCAINSCRNUR NONE DETECTED 01/01/2017 0845   LABBENZ NONE DETECTED 01/01/2017 0845   AMPHETMU NONE DETECTED 01/01/2017 0845   THCU NONE DETECTED 01/01/2017 0845   LABBARB NONE DETECTED 01/01/2017 0845    Alcohol Level:  Recent Labs Lab 01/01/17 0845  ETH <5  Other results: EKG: sinus tachycardia at 107bpm.  Imaging: Dg Chest 1 View  Result Date: 01/01/2017 CLINICAL DATA:  Altered mental status. EXAM: CHEST 1 VIEW COMPARISON:  02/28/2014. FINDINGS: Cardiomegaly with normal pulmonary vascularity. No focal infiltrate. No pleural effusion or pneumothorax. IMPRESSION: Cardiomegaly.  No evidence CHF.  No acute pulmonary disease. Electronically Signed   By: Maisie Fus  Register   On: 01/01/2017 09:31   Ct Head Wo Contrast  Result Date: 01/01/2017 CLINICAL DATA:  Found down.  Altered mental status. EXAM: CT HEAD WITHOUT CONTRAST TECHNIQUE: Contiguous axial images were obtained from the base of the skull  through the vertex without intravenous contrast. COMPARISON:  None. FINDINGS: Brain: There is atrophy and chronic small vessel disease changes. 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 No acute intracranial abnormality. Specifically, no hemorrhage, hydrocephalus, mass lesion, acute infarction, or significant intracranial injury. Vascular: No hyperdense vessel or unexpected calcification. Skull: No acute calvarial abnormality. Sinuses/Orbits: Visualized paranasal sinuses and mastoids clear. Orbital soft tissues unremarkable. 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 Atrophy, chronic small vessel disease. No acute intracranial abnormality. Electronically Signed   By: Charlett Nose M.D.   On: 01/01/2017 09:25     Assessment/Plan: 76 year old female presenting with status epilepticus and elevated BS (hyperglycemic hyperosmolar nonketotic state).  Patient without previous history of seizures.  ABG shows hypocapnia.  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  Patient currently on Keppra (loaded with 1000mg  and maintenance of 500mg  BID).  Although no further seizure activity noted clinically can not rule out continued subclinical seizure activity.  Right sided focality noted on examination may very well represent a Todd's since patient with chronic left sided ischemia on imaging.    Recommendations: 1.  EEG stat 2.  Continue Keppra for now but patient will not likely require anticonvulsant therapy for the long term.   3.  Agree with addressing metabolic issues.   4.  Further imaging not indicated at this time.     Thana Farr, MD Neurology [phone] 01/01/2017, 12:04 PM

## 2017-01-01 NOTE — H&P (Signed)
PULMONARY / CRITICAL CARE MEDICINE   Name: Kristin Mercer MRN: [id number] DOB: [birth year]    ADMISSION DATE:  01/01/2017  PT PROFILE:   Very frail, nonambulatory 76 y.o. female former smoker with DM, largely house bound and total care by sons with no prior seizure history admitted with status epilepticus and severe hyperglycemia. 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 Atrophy, chronic small vessel disease 03/27 Neurology consultation  INDWELLING DEVICES::   MICRO DATA:   ANTIMICROBIALS:     HISTORY OF PRESENT ILLNESS:   As above. Pt's son went to check on her in the morning as per usual routine and found her convulsing. EMS dispatched and convulsions continued until arrival in the ED. Received lorazepam and Keppra load by ER MD. Pt is somnolent and unable to provide history. Sons report that she is very frail and physically limited but cognitively intact. She has been non-ambulatory since a hip fracture a few yrs ago.   PAST MEDICAL HISTORY :  DM2 Htn Prior hip fracture  PAST SURGICAL HISTORY: She  has no past surgical history on file.  Current Meds  Medication Sig  . glipiZIDE (GLUCOTROL) 5 MG tablet Take 2 tablets by mouth 2 (two) times daily.  Marland Kitchen lisinopril (PRINIVIL,ZESTRIL) 10 MG tablet Take 10 mg by mouth daily.  . metFORMIN (GLUCOPHAGE) 1000 MG tablet Take 1,000 mg by mouth 2 (two) times daily with a meal.  . sitaGLIPtin (JANUVIA) 100 MG tablet Take 100 mg by mouth daily.    FAMILY HISTORY:  Noncontributory  SOCIAL HISTORY: Former smoker - quit 2-3 years ago  REVIEW OF SYSTEMS:   Level 5 caveat  SUBJECTIVE:    VITAL SIGNS: BP (!) 148/90   Pulse (!) 34   Temp 99.1 F (37.3 C) (Rectal)   Resp 14   Ht 4\' 11"  (1.499 m)   Wt 75 lb (34 kg)   SpO2 100%   BMI 15.15 kg/m   HEMODYNAMICS:     VENTILATOR SETTINGS:    INTAKE / OUTPUT: No intake/output data recorded.  PHYSICAL EXAMINATION: General: Very frail, cachectic, somnolent Neuro: PERRL, minimal spontaneous movement, minimal withdrawal, DTRs symmetric HEENT: NCAT, very poor dentition, sclerae white Cardiovascular: reg, no M Lungs: sonorous respirations, no adventitious sounds Abdomen: scaphoid, decreased BS, no masses Ext: severe large muscle atrophy, no edema, warm Skin: no lesions noted  LABS:  BMET  Recent Labs Lab 01/01/17 0845  NA 137  K 4.9  CL 105  CO2 20*  BUN 66*  CREATININE 2.55*  GLUCOSE 718*    Electrolytes  Recent Labs Lab 01/01/17 0845  CALCIUM 9.3    CBC  Recent Labs Lab 01/01/17 0845  WBC 13.6*  HGB 11.6*  HCT 35.7  PLT 170    Coag's No results for input(s): APTT, INR in the last 168 hours.  Sepsis Markers  Recent Labs Lab 01/01/17 0845  LATICACIDVEN 3.5*    ABG No results for input(s): PHART, PCO2ART, PO2ART in the last 168 hours.  Liver Enzymes  Recent Labs Lab 01/01/17 0845  AST 32  ALT 40  ALKPHOS 95  BILITOT 0.5  ALBUMIN 4.0    Cardiac Enzymes  Recent Labs Lab 01/01/17 0845  TROPONINI 0.04*    Glucose  Recent Labs Lab 01/01/17 0846 01/01/17 1054 01/01/17 1205  GLUCAP >600* 543* 399*    CXR: NACPD  ASSESSMENT: 1) Status epilepticus - new onset seizures. Possibly related to prior (undiagnosed) CVAs and metabolic derangements 2) DM2 with severe hyperglycemia 3) Compromised airway due to AMS 4) AKI 5) AMS - post-ictal +/- hyperosmolar state +/- meds (lorazepam) 6) DMR/DNI - this has been addressed previously by pt and is appropriate given her extremely frail state and poor QOL  PLAN: SDU admission Neuro consult Keppra per pharmacy dosing Neuro consult Insulin infusion Monitor BMET intermittently Monitor I/Os Correct electrolytes as indicated Sons updated in ER room   Billy Fischeravid Tequia Wolman, MD PCCM service Mobile  [phone] Pager [phone] 01/01/2017, 12:59 PM

## 2017-01-01 NOTE — ED Provider Notes (Signed)
[facility] Emergency Department Provider Note       Time seen: ----------------------------------------- 8:45 AM on 01/01/2017 -----------------------------------------  L5 caveat: Review of systems and history is limited by altered mental status.   I have reviewed the triage vital signs and the nursing notes.   HISTORY   Chief Complaint No chief complaint on file.    HPI Kristin Mercer is a 76 y.o. female who presents to the ED for seizure-like activity. Patient is brought in with rhythmic contractions and unresponsive. No further information is available.   No past medical history on file.  There are no active problems to display for this patient.   No past surgical history on file.  Allergies Patient has no allergy information on record.  Social History Social History  Substance Use Topics  . Smoking status: Not on file  . Smokeless tobacco: Not on file  . Alcohol use Not on file    Review of Systems Unknown at this time, positive for seizure-like activity.  ____________________________________________   PHYSICAL EXAM:  VITAL SIGNS: ED Triage Vitals  Enc Vitals Group     BP      Pulse      Resp      Temp      Temp src      SpO2      Weight      Height      Head Circumference      Peak Flow      Pain Score      Pain Loc      Pain Edu?      Excl. in GC?     Constitutional: Alert and not responsive, appears to be actively seizing, cachexia Eyes: Conjunctivae are normal. Pupils are equal, horizontal nystagmus is noted ENT   Head: Normocephalic and atraumatic.   Nose: No congestion/rhinnorhea.   Mouth/Throat: Mucous membranes are moist.   Neck: No stridor. Cardiovascular: Normal rate, regular rhythm. No murmurs, rubs, or gallops. Respiratory: Normal respiratory effort without tachypnea nor retractions.  Gastrointestinal: Normal bowel sounds Musculoskeletal: No lower edema. Neurologic: Patient is  actively seizing racial grimacing, nystagmus, right gaze preference, extension of the both wrists Skin:  Skin is warm, dry and intact. No rash noted. Psychiatric: Unable to assess during seizure ____________________________________________  EKG: Interpreted by me. Sinus tachycardia with a rate of 107 bpm, normal PR interval, normal QRS, normal QT, likely old anterior infarct age indeterminate.  ____________________________________________  ED COURSE:  Pertinent labs & imaging results that were available during my care of the patient were reviewed by me and considered in my medical decision making (see chart for details). Patient presents for seizures, we will assess with labs and imaging as indicated. Patient is being stabilized with Ativan. She will need extensive workup including CT imaging and labs. Clinical Course as of Jan 01 917  Tue Jan 01, 2017  0907 Family states she is DO NOT RESUSCITATE, DO NOT INTUBATE  [JW]    Clinical Course User Index [JW] Emily FilbertJonathan E Khalessi Blough, MD   Procedures ____________________________________________   LABS (pertinent positives/negatives)  Labs Reviewed  CBC WITH DIFFERENTIAL/PLATELET - Abnormal; Notable for the following:       Result Value   WBC 13.6 (*)    RBC 3.79 (*)    Hemoglobin 11.6 (*)    Neutro Abs 11.8 (*)    All other components within normal limits  COMPREHENSIVE METABOLIC PANEL - Abnormal; Notable for the following:    CO2 20 (*)  Glucose, Bld 718 (*)    BUN 66 (*)    Creatinine, Ser 2.55 (*)    GFR calc non Af Amer 17 (*)    GFR calc Af Amer 20 (*)    All other components within normal limits  TROPONIN I - Abnormal; Notable for the following:    Troponin I 0.04 (*)    All other components within normal limits  URINALYSIS, COMPLETE (UACMP) WITH MICROSCOPIC - Abnormal; Notable for the following:    Color, Urine YELLOW (*)    APPearance HAZY (*)    Glucose, UA >=500 (*)    Squamous Epithelial / LPF 0-5 (*)    All other  components within normal limits  BLOOD GAS, VENOUS - Abnormal; Notable for the following:    pCO2, Ven 19 (*)    pO2, Ven 225.0 (*)    All other components within normal limits  LACTIC ACID, PLASMA - Abnormal; Notable for the following:    Lactic Acid, Venous 3.5 (*)    All other components within normal limits  GLUCOSE, CAPILLARY - Abnormal; Notable for the following:    Glucose-Capillary >600 (*)    All other components within normal limits  ETHANOL  URINE DRUG SCREEN, QUALITATIVE ([facility] ONLY)  LACTIC ACID, PLASMA  CBG MONITORING, ED   CRITICAL CARE Performed by: Emily Filbert   Total critical care time: 30 minutes  Critical care time was exclusive of separately billable procedures and treating other patients.  Critical care was necessary to treat or prevent imminent or life-threatening deterioration.  Critical care was time spent personally by me on the following activities: development of treatment plan with patient and/or surrogate as well as nursing, discussions with consultants, evaluation of patient's response to treatment, examination of patient, obtaining history from patient or surrogate, ordering and performing treatments and interventions, ordering and review of laboratory studies, ordering and review of radiographic studies, pulse oximetry and re-evaluation of patient's condition.   RADIOLOGY Images were viewed by me  CT head, chest x-ray IMPRESSION: Cardiomegaly. No evidence CHF. No acute pulmonary disease.  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  Atrophy, chronic small vessel disease.  No acute intracranial abnormality. ____________________________________________  FINAL ASSESSMENT AND PLAN  Seizures, hyperglycemic hyperosmolar Nonketotic coma  Plan: Patient's labs and imaging were dictated above. Patient had presented for altered mental status which is likely secondary to hyperglycemic hyperosmolar nonketotic state. She has  a normal anion gap at this time, blood sugar is found to be 718. She is still unresponsive. Family states she did not want overly aggressive care. We will start her on antiepileptic medication as well as an insulin drip and fluids. She will likely need to start in the ICU and reevaluate.   Emily Filbert, MD   Note: This note was generated in part or whole with voice recognition software. Voice recognition is usually quite accurate but there are transcription errors that can and very often do occur. I apologize for any typographical errors that were not detected and corrected.     Emily Filbert, MD 01/01/17 1007

## 2017-01-01 NOTE — ED Notes (Signed)
Date and time results received: 01/01/17 0932 (use smartphrase ".now" to insert current time)  Test: Lactic 3.5 Critical Value: 3.5  Name of Provider Notified: Dr Mayford KnifeWilliams  Orders Received? Or Actions Taken?: no

## 2017-01-01 NOTE — ED Notes (Signed)
Upon arriving back to room ED2 from CT the pt sats dropped to 70's on 2L [place], EDP called to the bedside and a NRB is placed on the pt at 15L.. sats now 100%.. Will continue to monitor the pt..Kristin Mercer

## 2017-01-01 NOTE — Progress Notes (Signed)
Pt placed on Bipap due to dropping O2 saturations.

## 2017-01-01 NOTE — ED Notes (Signed)
Report called to Tiffany RN , pt will go to floor after EEG complete with a curtesy call prior to leaving the ED..Marland Kitchen

## 2017-01-01 NOTE — Progress Notes (Signed)
Sats dropped to mid 70's - low 80's.  Placed on non-rebreather.  Sats now upper 80's low 90's.  [place], NP made aware.  Now orders for bipap received.

## 2017-01-01 NOTE — ED Notes (Signed)
EEG being performed at the bedside , states it will take 1-1.5hrs to complete..Marland Kitchen

## 2017-01-01 NOTE — Progress Notes (Signed)
E- Link RN called for clarification on the need for basal insulin at this time. Insulin Drip was stopped prior to arrival on unit, with no Basal Insulin given per EPIC. Blood sugars being monitored qhour and patient's blood sugars have been 138 and 107 since arrival. E-link to call back with clarification of insulin administration.

## 2017-01-02 DIAGNOSIS — E43 Unspecified severe protein-calorie malnutrition: Secondary | ICD-10-CM | POA: Insufficient documentation

## 2017-01-02 DIAGNOSIS — L8990 Pressure ulcer of unspecified site, unspecified stage: Secondary | ICD-10-CM | POA: Insufficient documentation

## 2017-01-02 DIAGNOSIS — E118 Type 2 diabetes mellitus with unspecified complications: Secondary | ICD-10-CM

## 2017-01-02 DIAGNOSIS — N179 Acute kidney failure, unspecified: Secondary | ICD-10-CM

## 2017-01-02 DIAGNOSIS — R569 Unspecified convulsions: Secondary | ICD-10-CM

## 2017-01-02 LAB — BASIC METABOLIC PANEL
Anion gap: 6 (ref 5–15)
BUN: 54 mg/dL — ABNORMAL HIGH (ref 6–20)
CALCIUM: 9.2 mg/dL (ref 8.9–10.3)
CO2: 23 mmol/L (ref 22–32)
CREATININE: 1.99 mg/dL — AB (ref 0.44–1.00)
Chloride: 115 mmol/L — ABNORMAL HIGH (ref 101–111)
GFR calc Af Amer: 27 mL/min — ABNORMAL LOW (ref >60)
GFR, EST NON AFRICAN AMERICAN: 23 mL/min — AB (ref >60)
Glucose, Bld: 94 mg/dL (ref 65–99)
POTASSIUM: 4.6 mmol/L (ref 3.5–5.1)
SODIUM: 144 mmol/L (ref 135–145)

## 2017-01-02 LAB — CBC
HEMATOCRIT: 34 % — AB (ref 35.0–47.0)
Hemoglobin: 11.3 g/dL — ABNORMAL LOW (ref 12.0–16.0)
MCH: 30.9 pg (ref 26.0–34.0)
MCHC: 33.3 g/dL (ref 32.0–36.0)
MCV: 92.6 fL (ref 80.0–100.0)
PLATELETS: 150 10*3/uL (ref 150–440)
RBC: 3.68 MIL/uL — ABNORMAL LOW (ref 3.80–5.20)
RDW: 13.7 % (ref 11.5–14.5)
WBC: 10.5 10*3/uL (ref 3.6–11.0)

## 2017-01-02 LAB — GLUCOSE, CAPILLARY
GLUCOSE-CAPILLARY: 137 mg/dL — AB (ref 65–99)
Glucose-Capillary: 88 mg/dL (ref 65–99)
Glucose-Capillary: 95 mg/dL (ref 65–99)
Glucose-Capillary: 181 mg/dL — ABNORMAL HIGH (ref 65–99)
Glucose-Capillary: 184 mg/dL — ABNORMAL HIGH (ref 65–99)

## 2017-01-02 MED ORDER — LORAZEPAM 2 MG/ML IJ SOLN: 1.0000 mg | INTRAMUSCULAR | Status: DC | PRN

## 2017-01-02 MED ORDER — INSULIN ASPART 100 UNIT/ML ~~LOC~~ SOLN
0.0000 [IU] | SUBCUTANEOUS | Status: DC
  Administered 2017-01-02: 1 [IU] via SUBCUTANEOUS
  Administered 2017-01-02 (×3): 2 [IU] via SUBCUTANEOUS
  Administered 2017-01-03: 1 [IU] via SUBCUTANEOUS
  Administered 2017-01-03: 3 [IU] via SUBCUTANEOUS
  Administered 2017-01-03 (×2): 2 [IU] via SUBCUTANEOUS
  Administered 2017-01-03: 5 [IU] via SUBCUTANEOUS
  Administered 2017-01-03 – 2017-01-04 (×2): 2 [IU] via SUBCUTANEOUS
  Administered 2017-01-04: 3 [IU] via SUBCUTANEOUS
  Administered 2017-01-04: 5 [IU] via SUBCUTANEOUS
  Administered 2017-01-04: 9 [IU] via SUBCUTANEOUS
  Administered 2017-01-05: 7 [IU] via SUBCUTANEOUS
  Administered 2017-01-05 (×2): 3 [IU] via SUBCUTANEOUS
  Filled 2017-01-02 (×2): qty 2
  Filled 2017-01-02: qty 3
  Filled 2017-01-02 (×2): qty 2
  Filled 2017-01-02: qty 5
  Filled 2017-01-02: qty 2
  Filled 2017-01-02: qty 3
  Filled 2017-01-02: qty 9
  Filled 2017-01-02: qty 1
  Filled 2017-01-02: qty 5
  Filled 2017-01-02: qty 7
  Filled 2017-01-02 (×2): qty 2
  Filled 2017-01-02: qty 1
  Filled 2017-01-02: qty 3
  Filled 2017-01-02: qty 1

## 2017-01-02 MED ORDER — CHLORHEXIDINE GLUCONATE 0.12 % MT SOLN
15.0000 mL | Freq: Two times a day (BID) | OROMUCOSAL | Status: DC
  Administered 2017-01-02 – 2017-01-04 (×6): 15 mL via OROMUCOSAL
  Filled 2017-01-02 (×4): qty 15

## 2017-01-02 MED ORDER — ORAL CARE MOUTH RINSE
15.0000 mL | Freq: Two times a day (BID) | OROMUCOSAL | Status: DC
  Administered 2017-01-02 – 2017-01-04 (×6): 15 mL via OROMUCOSAL

## 2017-01-02 MED ORDER — SODIUM CHLORIDE 0.9 % IV SOLN
250.0000 mg | Freq: Two times a day (BID) | INTRAVENOUS | Status: DC
  Administered 2017-01-02 – 2017-01-05 (×6): 250 mg via INTRAVENOUS
  Filled 2017-01-02 (×8): qty 2.5

## 2017-01-02 MED ORDER — DEXTROSE-NACL 5-0.45 % IV SOLN
INTRAVENOUS | Status: DC
  Administered 2017-01-02 – 2017-01-04 (×4): via INTRAVENOUS

## 2017-01-02 NOTE — Progress Notes (Signed)
PULMONARY / CRITICAL CARE MEDICINE   Name: Kristin Mercer MRN: [id number] DOB: 1940/12/22    ADMISSION DATE:  01/01/2017  PT PROFILE:   Very frail, nonambulatory 76 y.o. female former smoker with DM, largely house bound and total care by sons with no prior seizure history admitted with status epilepticus and severe hyperglycemia. 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 Atrophy, chronic small vessel disease 03/27 Neurology consultation 03/27 EEG: left frontal sharp transients at [facility].  This is consistent with the patient's presentation of seizures and imaging showing a left frontal infarct   SUBJECTIVE:  Remains somnolent but more responsive than yesterday. No distress. Comfortable on room air.  VITAL SIGNS: BP 131/69   Pulse 92   Temp 97.6 F (36.4 C) (Axillary)   Resp 12   Ht 4\' 11"  (1.499 m)   Wt 74 lb 11.8 oz (33.9 kg)   SpO2 96%   BMI 15.09 kg/m   HEMODYNAMICS:    VENTILATOR SETTINGS: FiO2 (%):  [80 %-100 %] 80 %  INTAKE / OUTPUT: I/O last 3 completed shifts: In: 1531.1 [I.V.:1431.1; IV Piggyback:100] Out: 800 [Urine:800]  PHYSICAL EXAMINATION: General: Very frail, cachectic, somnolent Neuro: Moves all extremities, not following commands HEENT: NCAT, very poor dentition, sclerae white Cardiovascular: reg, no M Lungs: Full breath sounds without adventitious sounds Abdomen: scaphoid, decreased BS, no masses Ext: severe muscle atrophy, no edema, warm Skin: no lesions noted  LABS:  BMET  Recent Labs Lab 01/01/17 0845 01/02/17 0340  NA 137 144  K 4.9 4.6  CL 105 115*  CO2 20* 23  BUN 66* 54*  CREATININE 2.55* 1.99*  GLUCOSE 718* 94    Electrolytes  Recent Labs Lab 01/01/17 0845 01/02/17 0340  CALCIUM 9.3 9.2    CBC  Recent Labs Lab 01/01/17 0845 01/02/17 0340  WBC 13.6* 10.5   HGB 11.6* 11.3*  HCT 35.7 34.0*  PLT 170 150    Coag's No results for input(s): APTT, INR in the last 168 hours.  Sepsis Markers  Recent Labs Lab 01/01/17 0845  LATICACIDVEN 3.5*    ABG No results for input(s): PHART, PCO2ART, PO2ART in the last 168 hours.  Liver Enzymes  Recent Labs Lab 01/01/17 0845  AST 32  ALT 40  ALKPHOS 95  BILITOT 0.5  ALBUMIN 4.0    Cardiac Enzymes  Recent Labs Lab 01/01/17 0845  TROPONINI 0.04*    Glucose  Recent Labs Lab 01/01/17 1830 01/01/17 1922 01/01/17 2354 01/02/17 0438 01/02/17 0753 01/02/17 1210  GLUCAP 111* 115* 162* 88 95 181*    CXR: NNF    ASSESSMENT: 1) Status epilepticus - new onset seizures. Probably related to prior (undiagnosed) CVAs and metabolic derangements 2) DM2 with severe hyperglycemia - hyperglycemia resolved 3) AKI - improving. Not a candidate for hemodialysis. 4) AMS - likely post-ictal. Per her sons, she is cognitively intact at baseline 5) DNR/DNI   PLAN: Transfer to MedSurg floor [facility] to assume her care in a.m. 03/29. Discussed with Dr. Elpidio Anis Neurology following Keppra per pharmacy dosing Continue sliding scale insulin-sensitive scale Follow BMET Correct electrolytes as indicated Sons updated at bedside She will need a swallow eval at some point. Presently to somnolent to be able to perform. This has been requested for a.m. 03/29  PCCM will sign off as of a.m. 03/29  Billy Fischer, MD PCCM service  Mobile [phone] Pager [phone] 01/02/2017, 4:22 PM

## 2017-01-02 NOTE — Progress Notes (Signed)
Initial Nutrition Assessment  DOCUMENTATION CODES:   Severe malnutrition in context of chronic illness  INTERVENTION:  - Monitor for dietary advancement and SLP Evaluation - RD to provide appropriate oral nutrition supplements  NUTRITION DIAGNOSIS:   Malnutrition related to chronic illness as evidenced by severe depletion of body fat, severe depletion of muscle mass, percent weight loss.  GOAL:   Patient will meet greater than or equal to 90% of their needs  MONITOR:   Diet advancement, Labs, Weight trends, I & O's  REASON FOR ASSESSMENT:   Other (Comment) (Low BMI)    ASSESSMENT:   76 y.o. female former smoker with DM, largely house bound and total care by sons with no prior seizure history admitted with status epilepticus and severe hyperglycemia.  Discussed pt at ICU rounds, possible transfer today. Pt lives with son and he assists with all activities of daily living.  Spoke with pt's son's at bedside  Per pt's son's report pt has experienced recent weight loss. Per report pt weighed 94 lbs, equates to a 20 lb weight loss, in 3 months (21% weight loss- significant for time frame). Per chart review pt has no available recent weight history recorded.   Per report pt has had a decreased appetite lately. Per pt's son's report pt consumes small snacks through out the day, some examples are popcorn, clementine, and up to two Glucerna/d. Sons suggested Glucerna as oral nutrition supplement if it fits within her goals of care.   Labs reviewed; CBG [id number]) Medications reviewed; Sliding scale insulin, 50 mL IV D5 (240 calories)  Nutrition-focused physical exam completed. Findings include severe muscle depletion, severe fat depletion, and no edema. Pt is almost all bed bound but noted depletions where found in the orbital and temporal regions.   Diet Order:  Diet NPO time specified  Skin:  Wound (see comment) (Deep tissue injury at sacrum)  Last BM:  PTA  Height:   Ht  Readings from Last 1 Encounters:  01/01/17 4\' 11"  (1.499 m)    Weight:   Wt Readings from Last 1 Encounters:  01/02/17 74 lb 11.8 oz (33.9 kg)    Ideal Body Weight:  44.7 kg  BMI:  Body mass index is 15.09 kg/m.  Estimated Nutritional Needs:   Kcal:  [id number]  Protein:  51-68 grams  Fluid:  >/= 1.2 L/d  EDUCATION NEEDS:   Education needs no appropriate at this time  Fransisca KaufmannAllison Ioannides Dietetic Intern

## 2017-01-02 NOTE — Progress Notes (Signed)
SLP Cancellation Note  Patient Details Name: Kristin Mercer MRN: [id number] DOB: June 24, 1941   Cancelled treatment:       Reason Eval/Treat Not Completed: Fatigue/lethargy limiting ability to participate;Patient's level of consciousness (received order; chart reviewed; consulted NSG). NSG reported pt still drowsy post seizure, medications. Pt was admitted with status epilepticus and severe hyperglycemia. She is currently NPO. Per NSG, MD felt pt would not be ready for po's/BSE today but soon. ST services will f/u w/ BSE tomorrow as appropriate; recommend frequent oral care for hygiene and oral stimulation until then.    Jerilynn SomKatherine Watson, MS, CCC-SLP Watson,Katherine 01/02/2017, 1:38 PM

## 2017-01-02 NOTE — Progress Notes (Signed)
FBS 178

## 2017-01-02 NOTE — Care Management (Signed)
New onset seizure and found to have blood sugar over 700. Neuro consult performed.  EEG. Son -  has been patient primary caregiver fore the past 25 years.  Kathlene NovemberMike has the support and help from two other brothers. 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  She is essentially bedbound .  She is able to feed herself but relies on others for all aspects of her care.  At discharge it is family's wish that patient return home.  [facility] but Kathlene NovemberMike at present cannot recall  the name. no issues getting patient to appointments

## 2017-01-02 NOTE — Progress Notes (Signed)
76 y/o F admitted with status epilepticus possibly due to brain injury from infarcts. Keppra 1000 mg iv bolus given then 500 mg iv q 12 hours. Pharmacy was consulted to assist with Keppra dosing as patient is small in stature. Will decrease Keppra to 250 mg iv q 12 hours with weight of 33 kg and SCr of 1.99. Patient will not likely require long term anticonvulsant therapy per neurology note.   Luisa HartScott Mozelle Remlinger, PharmD Clinical Pharmacist

## 2017-01-02 NOTE — Progress Notes (Signed)
Very frail, nonambulatory 76 y.o. female former smoker with DM, largely house bound and total care by sons with no prior seizure history admitted with status epilepticus and severe hyperglycemia. Loaded with keppra. Neurology on board. Blood sugars better.  Being transferred out to floor. Will take over from tomorrow

## 2017-01-02 NOTE — Progress Notes (Signed)
Patient more alert from this morning- son at bedside states that patient has said a "few words."  Patient's vitals stable- foley output adequate.  Patient resting at this time-Family at bedside.  Patient to transfer to floor.

## 2017-01-02 NOTE — Progress Notes (Signed)
Subjective: Patient more alert.    Objective: Current vital signs: BP 131/69   Pulse 92   Temp 97.6 F (36.4 C) (Axillary)   Resp 12   Ht 4\' 11"  (1.499 m)   Wt 33.9 kg (74 lb 11.8 oz)   SpO2 96%   BMI 15.09 kg/m  Vital signs in last 24 hours: Temp:  [97.6 F (36.4 C)-97.8 F (36.6 C)] 97.6 F (36.4 C) (03/28 1248) Pulse Rate:  [79-106] 92 (03/28 1248) Resp:  [12-31] 12 (03/28 1248) BP: (118-171)/(53-107) 131/69 (03/28 1200) SpO2:  [82 %-100 %] 96 % (03/28 1248) FiO2 (%):  [80 %-100 %] 80 % (03/28 0800) Weight:  [33 kg (72 lb 12 oz)-33.9 kg (74 lb 11.8 oz)] 33.9 kg (74 lb 11.8 oz) (03/28 0500)  Intake/Output from previous day: 03/27 0701 - 03/28 0700 In: 1531.1 [I.V.:1431.1; IV Piggyback:100] Out: 800 [Urine:800] Intake/Output this shift: Total I/O In: -  Out: 150 [Urine:150] Nutritional status: Diet NPO time specified  Neurologic Exam: Mental Status: Opens eyes to name being called.  Does not follow commands.  No verbalizations noted.  Cranial Nerves: II: patient does not respond confrontation bilaterally, pupils right 2 mm, left 4 mm,and reactive on the left III,IV,VI: doll's response present bilaterally.  V,VII: corneal reflex reduced bilaterally, right facial droop  VIII: patient does not respond to verbal stimuli IX,X: gag reflex reduced, XI: trapezius strength unable to test bilaterally XII: tongue strength unable to test Motor: Moves left more than right spontaneously.   Lab Results: Basic Metabolic Panel:  Recent Labs Lab 01/01/17 0845 01/02/17 0340  NA 137 144  K 4.9 4.6  CL 105 115*  CO2 20* 23  GLUCOSE 718* 94  BUN 66* 54*  CREATININE 2.55* 1.99*  CALCIUM 9.3 9.2    Liver Function Tests:  Recent Labs Lab 01/01/17 0845  AST 32  ALT 40  ALKPHOS 95  BILITOT 0.5  PROT 7.5  ALBUMIN 4.0   No results for input(s): LIPASE, AMYLASE in the last 168 hours. No results for input(s): AMMONIA in the last 168 hours.  CBC:  Recent  Labs Lab 01/01/17 0845 01/02/17 0340  WBC 13.6* 10.5  NEUTROABS 11.8*  --   HGB 11.6* 11.3*  HCT 35.7 34.0*  MCV 94.2 92.6  PLT 170 150    Cardiac Enzymes:  Recent Labs Lab 01/01/17 0845  TROPONINI 0.04*    Lipid Panel: No results for input(s): CHOL, TRIG, HDL, CHOLHDL, VLDL, LDLCALC in the last 168 hours.  CBG:  Recent Labs Lab 01/01/17 1922 01/01/17 2354 01/02/17 0438 01/02/17 0753 01/02/17 1210  GLUCAP 115* 162* 88 95 181*    Microbiology: Results for orders placed or performed during the hospital encounter of 01/01/17  MRSA PCR Screening     Status: None   Collection Time: 01/01/17  3:48 PM  Result Value Ref Range Status   MRSA by PCR NEGATIVE NEGATIVE Final    Comment:        The GeneXpert MRSA Assay (FDA approved for NASAL specimens only), is one component of a comprehensive MRSA colonization surveillance program. It is not intended to diagnose MRSA infection nor to guide or monitor treatment for MRSA infections.     Coagulation Studies: No results for input(s): LABPROT, INR in the last 72 hours.  Imaging: Dg Chest 1 View  Result Date: 01/01/2017 CLINICAL DATA:  Altered mental status. EXAM: CHEST 1 VIEW COMPARISON:  02/28/2014. FINDINGS: Cardiomegaly with normal pulmonary vascularity. No focal infiltrate. No pleural effusion or  pneumothorax. IMPRESSION: Cardiomegaly.  No evidence CHF.  No acute pulmonary disease. Electronically Signed   By: Maisie Fus  Register   On: 01/01/2017 09:31   Ct Head Wo Contrast  Result Date: 01/01/2017 CLINICAL DATA:  Found down.  Altered mental status. EXAM: CT HEAD WITHOUT CONTRAST TECHNIQUE: Contiguous axial images were obtained from the base of the skull through the vertex without intravenous contrast. COMPARISON:  None. FINDINGS: Brain: There is atrophy and chronic small vessel disease changes. 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 No acute intracranial abnormality. Specifically, no hemorrhage, hydrocephalus,  mass lesion, acute infarction, or significant intracranial injury. Vascular: No hyperdense vessel or unexpected calcification. Skull: No acute calvarial abnormality. Sinuses/Orbits: Visualized paranasal sinuses and mastoids clear. Orbital soft tissues unremarkable. 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 Atrophy, chronic small vessel disease. No acute intracranial abnormality. Electronically Signed   By: Charlett Nose M.D.   On: 01/01/2017 09:25    Medications:  I have reviewed the patient's current medications. Scheduled: . chlorhexidine  15 mL Mouth Rinse BID  . heparin  5,000 Units Subcutaneous Q12H  . insulin aspart  0-9 Units Subcutaneous Q4H  . levETIRAcetam  250 mg Intravenous Q12H  . mouth rinse  15 mL Mouth Rinse q12n4p    Assessment/Plan: Patient more alert today.  EEG from yesterday only significant for left frontal sharp transients. No evidence of subclinical seizure activity.    Recommendations: 1. Agree with continued Keppra 2. Would continue ASA and give rectally if patient unable to take po.     LOS: 1 day   Thana Farr, MD Neurology [phone] 01/02/2017  2:44 PM

## 2017-01-02 NOTE — Progress Notes (Signed)
FBS 129 

## 2017-01-03 LAB — BASIC METABOLIC PANEL
Anion gap: 6 (ref 5–15)
BUN: 47 mg/dL — AB (ref 6–20)
CALCIUM: 8.9 mg/dL (ref 8.9–10.3)
CO2: 22 mmol/L (ref 22–32)
CREATININE: 1.82 mg/dL — AB (ref 0.44–1.00)
Chloride: 117 mmol/L — ABNORMAL HIGH (ref 101–111)
GFR calc non Af Amer: 25 mL/min — ABNORMAL LOW (ref >60)
GFR, EST AFRICAN AMERICAN: 30 mL/min — AB (ref >60)
Glucose, Bld: 146 mg/dL — ABNORMAL HIGH (ref 65–99)
Potassium: 4.1 mmol/L (ref 3.5–5.1)
SODIUM: 145 mmol/L (ref 135–145)

## 2017-01-03 LAB — GLUCOSE, CAPILLARY
GLUCOSE-CAPILLARY: 127 mg/dL — AB (ref 65–99)
GLUCOSE-CAPILLARY: 129 mg/dL — AB (ref 65–99)
GLUCOSE-CAPILLARY: 154 mg/dL — AB (ref 65–99)
GLUCOSE-CAPILLARY: 200 mg/dL — AB (ref 65–99)
GLUCOSE-CAPILLARY: 255 mg/dL — AB (ref 65–99)
Glucose-Capillary: 178 mg/dL — ABNORMAL HIGH (ref 65–99)
Glucose-Capillary: 145 mg/dL — ABNORMAL HIGH (ref 65–99)
Glucose-Capillary: 184 mg/dL — ABNORMAL HIGH (ref 65–99)
Glucose-Capillary: 235 mg/dL — ABNORMAL HIGH (ref 65–99)

## 2017-01-03 NOTE — Progress Notes (Signed)
Pt responding to voice. Is conversing a little bit with son at bedside. Foley patent and draining urine. Blood sugars under 200 this shift. Iv infusing through midline without difficulty. No seizure activity this shift. Son remaining at bedside.

## 2017-01-03 NOTE — Progress Notes (Signed)
FBS 145 

## 2017-01-03 NOTE — Progress Notes (Signed)
[facility] - [place] [facility]   PATIENT NAME: Jolayne HainesCarlyle Hagood    MR#:  [id number]  DATE OF BIRTH:  08-May-1941  SUBJECTIVE:  CHIEF COMPLAINT:   Chief Complaint  Patient presents with  . Seizures  . Hyperglycemia  at baseline, communicative and oriented, but completely bed bound. 3 son live with her and take care of her. She feeds herself.  Came with status epilapticus, started on keppra, Slowly improving, spoke some today.  REVIEW OF SYSTEMS:  Not taking much,so not able to give ROS.  ROS  DRUG ALLERGIES:  No Known Allergies  VITALS:  Blood pressure (!) 146/66, pulse 93, temperature 98.1 F (36.7 C), temperature source Oral, resp. rate 18, height 4\' 11"  (1.499 m), weight 34.6 kg (76 lb 3.2 oz), SpO2 98 %.  PHYSICAL EXAMINATION:  GENERAL:  76 y.o.-year-old patient lying in the bed with no acute distress.  EYES: Pupils equal, round, reactive to light and accommodation. No scleral icterus. Extraocular muscles intact.  HEENT: Head atraumatic, normocephalic. Oropharynx and nasopharynx clear.  NECK:  Supple, no jugular venous distention. No thyroid enlargement, no tenderness.  LUNGS: Normal breath sounds bilaterally, no wheezing, rales,rhonchi or crepitation. No use of accessory muscles of respiration.  CARDIOVASCULAR: S1, S2 normal. No murmurs, rubs, or gallops.  ABDOMEN: Soft, nontender, nondistended. Bowel sounds present. No organomegaly or mass.  EXTREMITIES: No pedal edema, cyanosis, or clubbing.  NEUROLOGIC: Cranial nerves II through XII are intact. Muscle strength 2-3/5 in all extremities. Sensation not checked . Gait not checked.  PSYCHIATRIC: The patient is alert and not oriented.  SKIN: No obvious rash, lesion, or ulcer.   Physical Exam LABORATORY PANEL:   CBC  Recent Labs Lab 01/02/17 0340  WBC 10.5  HGB 11.3*  HCT 34.0*  PLT 150    ------------------------------------------------------------------------------------------------------------------  Chemistries   Recent Labs Lab 01/01/17 0845  01/03/17 0427  NA 137  < > 145  K 4.9  < > 4.1  CL 105  < > 117*  CO2 20*  < > 22  GLUCOSE 718*  < > 146*  BUN 66*  < > 47*  CREATININE 2.55*  < > 1.82*  CALCIUM 9.3  < > 8.9  AST 32  --   --   ALT 40  --   --   ALKPHOS 95  --   --   BILITOT 0.5  --   --   < > = values in this interval not displayed. ------------------------------------------------------------------------------------------------------------------  Cardiac Enzymes  Recent Labs Lab 01/01/17 0845  TROPONINI 0.04*   ------------------------------------------------------------------------------------------------------------------  RADIOLOGY:  No results found.  ASSESSMENT AND PLAN:   Active Problems:   Status epilepticus (HCC)   Pressure injury of skin   Protein-calorie malnutrition, severe   Seizure (HCC)   AKI (acute kidney injury) (HCC)   Type 2 diabetes mellitus with complication (HCC)  * status epilapticus   On keppra   No more seizures.   Appreciated neurology consult.  * altered mental status   Metabolic encephalopathy   Due to epilapsy. On keppra.   BUN was also high on presentation, monitor.   Slowly improving. Cont monitoring.  * Ac kidney injury   Gradually improving now.    * Diabetes   Insulin sliding scale.   All the records are reviewed and case discussed with Care Management/Social Workerr. Management plans discussed with the patient, family and they are in agreement.  CODE STATUS: DNR  TOTAL TIME TAKING CARE OF THIS PATIENT: 35 minutes.  POSSIBLE D/C IN 1-2 DAYS, DEPENDING ON CLINICAL CONDITION.   Altamese Dilling M.D on 01/03/2017   Between 7am to 6pm - Pager - [phone]  After 6pm go to [email or web address] - Social research officer, government  [facility]  Office   [phone]  CC: Primary care physician; No PCP Per Patient  Note: This dictation was prepared with Dragon dictation along with smaller phrase technology. Any transcriptional errors that result from this process are unintentional.

## 2017-01-03 NOTE — Plan of Care (Signed)
Problem: Pain Managment: Goal: General experience of comfort will improve Outcome: Progressing No complaints of pain this shift. Pt able to sleep in between care.  Problem: Skin Integrity: Goal: Risk for impaired skin integrity will decrease Outcome: Progressing Turned Q2 hours.  Problem: Activity: Goal: Risk for activity intolerance will decrease Outcome: Not Progressing Pt bed bound at this time.  Problem: Nutrition: Goal: Adequate nutrition will be maintained Outcome: Not Progressing Pt is NPO at this time, will have swallow evaluation later on today

## 2017-01-03 NOTE — Progress Notes (Signed)
Subjective: Patient resting but easily awakened.  Per son has spoken some today and eaten some.  At baseline nonambulatory and incontinent but able to feed self and conversant.    Objective: Current vital signs: BP 124/61   Pulse 88   Temp 98.7 F (37.1 C) (Axillary)   Resp 16   Ht 4\' 11"  (1.499 m)   Wt 34.6 kg (76 lb 3.2 oz)   SpO2 98%   BMI 15.39 kg/m  Vital signs in last 24 hours: Temp:  [97.7 F (36.5 C)-98.7 F (37.1 C)] 98.7 F (37.1 C) (03/29 0845) Pulse Rate:  [84-102] 88 (03/29 0845) Resp:  [14-16] 16 (03/29 0845) BP: (122-162)/(60-78) 124/61 (03/29 0845) SpO2:  [97 %-98 %] 98 % (03/29 0845) Weight:  [34 kg (75 lb)-34.6 kg (76 lb 3.2 oz)] 34.6 kg (76 lb 3.2 oz) (03/29 0507)  Intake/Output from previous day: 03/28 0701 - 03/29 0700 In: 925 [I.V.:720; IV Piggyback:205] Out: 1075 [Urine:1075] Intake/Output this shift: No intake/output data recorded. Nutritional status: DIET - DYS 1 Room service appropriate? Yes with Assist; Fluid consistency: Nectar Thick  Neurologic Exam: Mental Status: Lethargic but easily awakened.  Follows some simple commands.   Cranial Nerves: II: Pupils reactive III,IV, VI: Extra-ocular motions grossly intact V,VII: right facial droop VIII: hearing normal bilaterally IX,X: gag reflex present Motor: Moves left more than right   Lab Results: Basic Metabolic Panel:  Recent Labs Lab 01/01/17 0845 01/02/17 0340 01/03/17 0427  NA 137 144 145  K 4.9 4.6 4.1  CL 105 115* 117*  CO2 20* 23 22  GLUCOSE 718* 94 146*  BUN 66* 54* 47*  CREATININE 2.55* 1.99* 1.82*  CALCIUM 9.3 9.2 8.9    Liver Function Tests:  Recent Labs Lab 01/01/17 0845  AST 32  ALT 40  ALKPHOS 95  BILITOT 0.5  PROT 7.5  ALBUMIN 4.0   No results for input(s): LIPASE, AMYLASE in the last 168 hours. No results for input(s): AMMONIA in the last 168 hours.  CBC:  Recent Labs Lab 01/01/17 0845 01/02/17 0340  WBC 13.6* 10.5  NEUTROABS 11.8*  --   HGB  11.6* 11.3*  HCT 35.7 34.0*  MCV 94.2 92.6  PLT 170 150    Cardiac Enzymes:  Recent Labs Lab 01/01/17 0845  TROPONINI 0.04*    Lipid Panel: No results for input(s): CHOL, TRIG, HDL, CHOLHDL, VLDL, LDLCALC in the last 168 hours.  CBG:  Recent Labs Lab 01/02/17 2018 01/02/17 2318 01/03/17 0324 01/03/17 0812 01/03/17 1218  GLUCAP 129* 178* 145* 200* 184*    Microbiology: Results for orders placed or performed during the hospital encounter of 01/01/17  MRSA PCR Screening     Status: None   Collection Time: 01/01/17  3:48 PM  Result Value Ref Range Status   MRSA by PCR NEGATIVE NEGATIVE Final    Comment:        The GeneXpert MRSA Assay (FDA approved for NASAL specimens only), is one component of a comprehensive MRSA colonization surveillance program. It is not intended to diagnose MRSA infection nor to guide or monitor treatment for MRSA infections.     Coagulation Studies: No results for input(s): LABPROT, INR in the last 72 hours.  Imaging: No results found.  Medications:  I have reviewed the patient's current medications. Scheduled: . chlorhexidine  15 mL Mouth Rinse BID  . heparin  5,000 Units Subcutaneous Q12H  . insulin aspart  0-9 Units Subcutaneous Q4H  . levETIRAcetam  250 mg Intravenous Q12H  .  mouth rinse  15 mL Mouth Rinse q12n4p    Assessment/Plan: Patient improving.  Remains on Keppra.  Will continue to follow with you.   LOS: 2 days   Thana Farr, MD Neurology [phone] 01/03/2017  2:25 PM

## 2017-01-03 NOTE — Evaluation (Signed)
Clinical/Bedside Swallow Evaluation Patient Details  Name: Kristin Mercer MRN: [id number] Date of Birth: [birth year]  Today's Date: 01/03/2017 Time: SLP Start Time (ACUTE ONLY): 1140 SLP Stop Time (ACUTE ONLY): 1240 SLP Time Calculation (min) (ACUTE ONLY): 60 min  Past Medical History:  Past Medical History:  Diagnosis Date  . Diabetes mellitus without complication (HCC)   . Hypertension   . Stroke [facility])    Past Surgical History:  Past Surgical History:  Procedure Laterality Date  . JOINT REPLACEMENT Right    HPI:  [name] who presented to the ED for seizure-like activity. Patient is brought in with rhythmic contractions and unresponsive. Patient given Ativan and loaded with Keppra. No further contractions noted. PMH includes Brain lesion, HTN, DM, tobacco use, ORIF s/p hip fx. Currently, pt is more awake, looking to SLP when name called. Pt did not answer basic questions asked of her but did state "water" when asked about something to drink. She did not follow any commaands but responded to verbal/tactile cues of spoon to lips. She required hand over hand to help hold the cup to drink from. Flat affect. Pt is largely house bound and total care by sons.   Assessment / Plan / Recommendation Clinical Impression  Pt presents w/ moderate-severe oral phase dysphagia which could significantly impact the pharyngeal swallow phase thus increasing risk for aspiration to occur. Pt was given TSP trials of Nectar liquids, and thins, as well as Purees(1/2 tsps) w/ oral phase dysphagia c/b prolonged A-P transfer and oral holding. Eventually given time and max cues, pt swallowed and cleared bolus trials; suspect delayed pharyngeal swallow initiation. Limited thin liquid trials given d/t the oral phase deficits noted and no solid trials were attempted d/t same. Attempted strategies of alternating foods and liquids, max tactile/visual cues and stimulation. Pt did not follow commands or  instruction thus is at increased risk for aspriation as well as nutritional risk secondary to above presentation. Recommend initial trial diet of Dysphagia level 1 w/ Nectar liquids via TSP w/ strict aspiration precautions and feeding support. Recommend Pills in Puree Crushed w/ NSG. Son and NSG updated.  SLP Visit Diagnosis: Dysphagia, oropharyngeal phase (R13.12)    Aspiration Risk  Moderate aspiration risk    Diet Recommendation  Dysphagia level 1 w/ Nectar liquids via TSP ONLY; strict aspiration precautions and feeding support only giving po's when fully awake/alert to follow through w/ task; verbal/tactile/visual cues to encourage swallowing and clearing of each food/liquid bolus; must monitor for oral clearing when eating/drinking  Medication Administration: Crushed with puree    Other  Recommendations Recommended Consults:  (Dietician consult) Oral Care Recommendations: Oral care BID;Staff/trained caregiver to provide oral care Other Recommendations: Order thickener from pharmacy;Prohibited food (jello, ice cream, thin soups);Remove water pitcher;Have oral suction available   Follow up Recommendations Skilled Nursing facility (TBD)      Frequency and Duration min 3x week  2 weeks       Prognosis Prognosis for Safe Diet Advancement: Guarded Barriers to Reach Goals: Cognitive deficits;Severity of deficits      Swallow Study   General Date of Onset: 01/01/17 HPI: [name] who presented to the ED for seizure-like activity. Patient is brought in with rhythmic contractions and unresponsive. Patient given Ativan and loaded with Keppra. No further contractions noted. PMH includes Brain lesion, HTN, DM, tobacco use, ORIF s/p hip fx. Currently, pt is more awake, looking to SLP when name called. Pt did not answer  basic questions asked of her but did state "water" when asked about something to drink. She did not follow any commaands but responded to verbal/tactile cues of spoon  to lips. She required hand over hand to help hold the cup to drink from. Flat affect. Pt is largely house bound and total care by sons. Type of Study: Bedside Swallow Evaluation Previous Swallow Assessment: none reported by Son but she did appear to have some difficulty w/ follow through w/ eating/drinking "sometimes when she would just squeeze the biscuit".  Diet Prior to this Study: Dysphagia 3 (soft);Thin liquids (described by Son) Temperature Spikes Noted: No (wbc down to 10.5) Respiratory Status: Room air History of Recent Intubation: No (brief BiPAP) Behavior/Cognition: Confused;Distractible;Requires cueing;Doesn't follow directions (awake; flat affect) Oral Cavity Assessment: Dry (difficult to assess d/t Cognitive status) Oral Care Completed by SLP: Recent completion by staff Oral Cavity - Dentition: Missing dentition Vision: Impaired for self-feeding Self-Feeding Abilities: Total assist Patient Positioning: Upright in bed Baseline Vocal Quality: Low vocal intensity (nonverbal mostly) Volitional Cough: Cognitively unable to elicit Volitional Swallow: Unable to elicit    Oral/Motor/Sensory Function Overall Oral Motor/Sensory Function: Generalized oral weakness (overall d/t Cognitive status - no follow through) Facial Symmetry: Within Functional Limits Lingual Symmetry: Within Functional Limits Velum:  (CNT ) Mandible:  (CNT)   Ice Chips Ice chips: Impaired Presentation: Spoon (fed; 2 trials) Oral Phase Impairments: Poor awareness of bolus;Reduced lingual movement/coordination Oral Phase Functional Implications: Oral holding;Prolonged oral transit Pharyngeal Phase Impairments: Suspected delayed Swallow (much delayed swallow completion)   Thin Liquid Thin Liquid: Impaired Presentation: Cup (fed w/ full assist; 2 trials) Oral Phase Impairments: Reduced labial seal;Reduced lingual movement/coordination;Poor awareness of bolus Oral Phase Functional Implications: Oral holding;Prolonged  oral transit Pharyngeal  Phase Impairments: Suspected delayed Swallow (much delayed swallow completion)    Nectar Thick Nectar Thick Liquid: Impaired Presentation: Spoon (fed; 10 trials) Oral Phase Impairments: Reduced labial seal;Reduced lingual movement/coordination;Poor awareness of bolus Oral phase functional implications: Prolonged oral transit;Oral holding Pharyngeal Phase Impairments: Suspected delayed Swallow   Honey Thick Honey Thick Liquid: Not tested   Puree Puree: Impaired Presentation: Spoon (fed; 8 trials) Oral Phase Impairments: Reduced labial seal;Reduced lingual movement/coordination;Poor awareness of bolus Oral Phase Functional Implications: Prolonged oral transit;Oral holding Pharyngeal Phase Impairments: Suspected delayed Swallow   Solid   GO   Solid: Not tested         Jerilynn SomKatherine Mackensi Mahadeo, MS, CCC-SLP Keondrick Dilks 01/03/2017,2:45 PM

## 2017-01-04 ENCOUNTER — Inpatient Hospital Stay: Payer: Medicare HMO

## 2017-01-04 LAB — GLUCOSE, CAPILLARY
GLUCOSE-CAPILLARY: 211 mg/dL — AB (ref 65–99)
GLUCOSE-CAPILLARY: 197 mg/dL — AB (ref 65–99)
GLUCOSE-CAPILLARY: 282 mg/dL — AB (ref 65–99)
GLUCOSE-CAPILLARY: 379 mg/dL — AB (ref 65–99)
Glucose-Capillary: 202 mg/dL — ABNORMAL HIGH (ref 65–99)
Glucose-Capillary: 449 mg/dL — ABNORMAL HIGH (ref 65–99)

## 2017-01-04 LAB — BASIC METABOLIC PANEL
ANION GAP: 6 (ref 5–15)
BUN: 38 mg/dL — ABNORMAL HIGH (ref 6–20)
CO2: 20 mmol/L — AB (ref 22–32)
Calcium: 8.7 mg/dL — ABNORMAL LOW (ref 8.9–10.3)
Chloride: 117 mmol/L — ABNORMAL HIGH (ref 101–111)
Creatinine, Ser: 1.75 mg/dL — ABNORMAL HIGH (ref 0.44–1.00)
GFR calc Af Amer: 31 mL/min — ABNORMAL LOW (ref >60)
GFR calc non Af Amer: 27 mL/min — ABNORMAL LOW (ref >60)
GLUCOSE: 234 mg/dL — AB (ref 65–99)
POTASSIUM: 4.1 mmol/L (ref 3.5–5.1)
Sodium: 143 mmol/L (ref 135–145)

## 2017-01-04 LAB — GLUCOSE, RANDOM: Glucose, Bld: 509 mg/dL (ref 65–99)

## 2017-01-04 MED ORDER — INSULIN ASPART 100 UNIT/ML ~~LOC~~ SOLN
12.0000 [IU] | Freq: Once | SUBCUTANEOUS | Status: AC
  Administered 2017-01-04: 12 [IU] via SUBCUTANEOUS
  Filled 2017-01-04: qty 12

## 2017-01-04 NOTE — Progress Notes (Signed)
Pharmacy contacted for new insulin vile

## 2017-01-04 NOTE — Progress Notes (Signed)
Inpatient Diabetes Program Recommendations  AACE/ADA: New Consensus Statement on Inpatient Glycemic Control (2015)  Target Ranges:  Prepandial:   less than 140 mg/dL      Peak postprandial:   less than 180 mg/dL (1-2 hours)      Critically ill patients:  140 - 180 mg/dL   Lab Results  Component Value Date   GLUCAP 282 (H) 01/04/2017    Review of Glycemic Control  Results for CARLESHA, SEIPLE (MRN [id number]) as of 01/04/2017 11:35  Ref. Range 01/03/2017 23:37 01/04/2017 03:25 01/04/2017 04:42 01/04/2017 08:04 01/04/2017 11:19  Glucose-Capillary Latest Ref Range: 65 - 99 mg/dL 409 (H) 811 (H) 914 (H) 197 (H) 282 (H)    Diabetes history: Type 2 Outpatient Diabetes medications: Januvia  bid, Metformin  bid, Glipizide  bid Current orders for Inpatient glycemic control: Novolog 0-9 units q4h  Inpatient Diabetes Program Recommendations:  Dys 1 diet. - patient appears to have eaten 75% of her meal this  this am resulting in elevated CBG before lunch.   If patient is tolerating eating- please add Novolog 2 units tid with meals (through the glycemic control order set) and change Novolog to 0-9 units to tid and add Novolog 0-5 units qhs. (d/c Novolog 0-9 units q4h)  Susette Racer, RN, BA, [place], CDE Diabetes Coordinator Inpatient Diabetes Program  [phone] (Team Pager) [phone] [facility] Office) 01/04/2017 11:50 AM

## 2017-01-04 NOTE — Progress Notes (Signed)
  Speech Language Pathology Treatment: Dysphagia  Patient Details Name: Kristin Mercer MRN: [id number] DOB: 09-Nov-1940 Today's Date: 01/04/2017 Time: 4098-1191 SLP Time Calculation (min) (ACUTE ONLY): 35 min  Assessment / Plan / Recommendation Clinical Impression  Pt is improving overall per Sons' report and NSG; pt has verbalized a few more words this morning and has been more engaged in eating/drinking when fed. Sons noted less oral holding when feeding her the breakfast meal this morning. Pt appears more alert/awake; more timely A-P transfer for swallowing/clearing w/ small cup sips of Nectar consistency liquids - she appears more eager for sips from Cup than w/ tsp. Thorough Education had w/ Sons on aspiration precautions including monitoring for oral clearing and strategies to support swallowing and less oral holding; feeding support at meals; Education on food consistency w/ her diet - options, preparation of foods, flavoring. Discussed w/ Sons the need to remain on a dysphagia consistency diet to be conservative to lessen risk for choking and aspiration of thin liquids secondary to changes in Neurological status(some baseline Cognitive decline prior) as she is continuing to improve at this time. Sons agreed. ST services will f/u w/ toleration of diet and trials to upgrade diet consistency as appropriate next 2-3 days. Recommend continued Dietician f/u for nutritional supplements in diet. NSG updated.    HPI HPI: [name] who presented to the ED for seizure-like activity. Patient is brought in with rhythmic contractions and unresponsive. Patient given Ativan and loaded with Keppra. No further contractions noted. PMH includes Brain lesion, HTN, DM, tobacco use, ORIF s/p hip fx. Currently, pt is more awake, looking to SLP when name called. Pt did not answer basic questions asked of her but did state "water" when asked about something to drink. She did not follow any commaands but  responded to verbal/tactile cues of spoon to lips. She required hand over hand to help hold the cup to drink from. Flat affect. Pt is largely house bound and total care by sons.      SLP Plan  Continue with current plan of care       Recommendations  Diet recommendations: Dysphagia 1 (puree);Nectar-thick liquid Liquids provided via: Cup;Teaspoon Medication Administration: Crushed with puree Supervision: Staff to assist with self feeding;Full supervision/cueing for compensatory strategies (have pt help hold cup as able) Compensations: Minimize environmental distractions;Slow rate;Small sips/bites;Multiple dry swallows after each bite/sip;Lingual sweep for clearance of pocketing;Follow solids with liquid Postural Changes and/or Swallow Maneuvers: Seated upright 90 degrees;Upright 30-60 min after meal                General recommendations:  (Dietician f/u) Oral Care Recommendations: Oral care BID;Staff/trained caregiver to provide oral care Follow up Recommendations: Skilled Nursing facility (TBD) SLP Visit Diagnosis: Dysphagia, oropharyngeal phase (R13.12) Plan: Continue with current plan of care       GO                Jerilynn Som, MS, CCC-SLP Kayne Yuhas 01/04/2017, 11:38 AM

## 2017-01-04 NOTE — Progress Notes (Signed)
Pt alert to voice. Conversing some with the son, not talking much to staff. Able to sleep in between care. No seizure activity noted during the night. No complaints of pain.

## 2017-01-04 NOTE — Progress Notes (Addendum)
[facility] [place] at [facility]   PATIENT NAME: Kristin Mercer    MR#:  [id number]  DATE OF BIRTH:  1941/05/17  SUBJECTIVE:   Son at bedside. Patient is alert cannot comprehend her speech  REVIEW OF SYSTEMS:    Review of Systems  Unable to perform ROS: Acuity of condition    Tolerating Diet: eating very little      DRUG ALLERGIES:  No Known Allergies  VITALS:  Blood pressure (!) 147/73, pulse 83, temperature 97.6 F (36.4 C), temperature source Oral, resp. rate 16, height  (1.499 m), weight 34.6 kg (76 lb 3.2 oz), SpO2 96 %.  PHYSICAL EXAMINATION:   Physical Exam  Constitutional: No distress.  Thin frail  HENT:  Head: Normocephalic.  Eyes: No scleral icterus.  Neck: Normal range of motion. Neck supple. No JVD present. No tracheal deviation present.  Cardiovascular: Normal rate, regular rhythm and normal heart sounds.  Exam reveals no gallop and no friction rub.   No murmur heard. Pulmonary/Chest: Effort normal and breath sounds normal. No respiratory distress. She has no wheezes. She has no rales. She exhibits no tenderness.  Abdominal: Soft. Bowel sounds are normal. She exhibits no distension and no mass. There is no tenderness. There is no rebound and no guarding.  Musculoskeletal: She exhibits no edema.  Neurological: She is alert.  Skin: Skin is warm. No rash noted. No erythema.      LABORATORY PANEL:   CBC  Recent Labs Lab 01/02/17 0340  WBC 10.5  HGB 11.3*  HCT 34.0*  PLT 150   ------------------------------------------------------------------------------------------------------------------  Chemistries   Recent Labs Lab 01/01/17 0845  01/04/17 0411  NA 137  < > 143  K 4.9  < > 4.1  CL 105  < > 117*  CO2 20*  < > 20*  GLUCOSE 718*  < > 234*  BUN 66*  < > 38*  CREATININE 2.55*  < > 1.75*  CALCIUM 9.3  < > 8.7*  AST 32  --   --   ALT 40  --   --   ALKPHOS 95  --   --   BILITOT 0.5  --   --   < > = values  in this interval not displayed. ------------------------------------------------------------------------------------------------------------------  Cardiac Enzymes  Recent Labs Lab 01/01/17 0845  TROPONINI 0.04*   ------------------------------------------------------------------------------------------------------------------  RADIOLOGY:  No results found.   ASSESSMENT AND PLAN:    76 year old female with diabetes largely housebound and total care by sons who presented with status epilepticus  1. Status epilepticus: Continue Keppra Change to oral when okayed by neurology Appreciate neurology consultation   2. 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 Type 2 diabetes with severe hyperglycemia on presentation: Diabetes  Consultation   Continue sliding scale insulin for now   Management plans discussed with the patient's son and he is in agreement.  CODE STATUS: DNR  TOTAL TIME TAKING CARE OF THIS PATIENT: 30 minutes.     POSSIBLE D/C tomorrow to home, DEPENDING ON CLINICAL CONDITION.   Kristin Mercer M.D on 01/04/2017 at 9:50 AM  Between 7am to 6pm - Pager - [phone] After 6pm go to [email or web address] - Social research officer, government  [facility]  Office  [phone]  CC: Primary care physician; No PCP Per Patient  Note: This dictation was prepared with Dragon dictation along with smaller phrase technology. Any transcriptional errors that result from this process are  unintentional. 

## 2017-01-04 NOTE — Plan of Care (Signed)
Problem: SLP Dysphagia Goals Goal: Misc Dysphagia Goal Pt will safely tolerate po diet of least restrictive consistency w/ no overt s/s of aspiration noted by Staff/pt/family x3 sessions.    

## 2017-01-05 LAB — GLUCOSE, CAPILLARY
GLUCOSE-CAPILLARY: 432 mg/dL — AB (ref 65–99)
GLUCOSE-CAPILLARY: 214 mg/dL — AB (ref 65–99)
GLUCOSE-CAPILLARY: 233 mg/dL — AB (ref 65–99)
GLUCOSE-CAPILLARY: 291 mg/dL — AB (ref 65–99)
Glucose-Capillary: 310 mg/dL — ABNORMAL HIGH (ref 65–99)

## 2017-01-05 LAB — CBC
HEMATOCRIT: 30.1 % — AB (ref 35.0–47.0)
HEMOGLOBIN: 10.1 g/dL — AB (ref 12.0–16.0)
MCH: 31 pg (ref 26.0–34.0)
MCHC: 33.6 g/dL (ref 32.0–36.0)
MCV: 92.4 fL (ref 80.0–100.0)
Platelets: 110 10*3/uL — ABNORMAL LOW (ref 150–440)
RBC: 3.25 MIL/uL — ABNORMAL LOW (ref 3.80–5.20)
RDW: 14.1 % (ref 11.5–14.5)
WBC: 10.8 10*3/uL (ref 3.6–11.0)

## 2017-01-05 MED ORDER — INSULIN ASPART 100 UNIT/ML ~~LOC~~ SOLN: 2.0000 [IU] | Freq: Three times a day (TID) | SUBCUTANEOUS | Status: DC

## 2017-01-05 MED ORDER — LEVETIRACETAM 250 MG PO TABS: 250.0000 mg | ORAL_TABLET | Freq: Two times a day (BID) | ORAL | 0 refills | Status: AC

## 2017-01-05 MED ORDER — INSULIN ASPART 100 UNIT/ML ~~LOC~~ SOLN: 0.0000 [IU] | Freq: Three times a day (TID) | SUBCUTANEOUS | Status: DC

## 2017-01-05 MED ORDER — INSULIN ASPART 100 UNIT/ML ~~LOC~~ SOLN: 0.0000 [IU] | Freq: Every day | SUBCUTANEOUS | Status: DC

## 2017-01-05 MED ORDER — INSULIN ASPART 100 UNIT/ML ~~LOC~~ SOLN
0.0000 [IU] | Freq: Three times a day (TID) | SUBCUTANEOUS | Status: DC
Start: 2017-01-05 — End: 2017-01-05

## 2017-01-05 NOTE — Discharge Summary (Signed)
[facility] - [place] at [facility]   PATIENT NAME: Kristin Mercer    MR#:  [id number]  DATE OF BIRTH:  05-15-1941  DATE OF ADMISSION:  01/01/2017 ADMITTING PHYSICIAN: Merwyn Katos, MD  DATE OF DISCHARGE: 01/05/2017  PRIMARY CARE PHYSICIAN: No PCP Per Patient    ADMISSION DIAGNOSIS:  Seizure (HCC) [R56.9] Hyperglycemic hyperosmolar nonketotic coma (HCC) [E11.01]  DISCHARGE DIAGNOSIS:  Active Problems:   Status epilepticus (HCC)   Pressure injury of skin   Protein-calorie malnutrition, severe   Seizure (HCC)   AKI (acute kidney injury) (HCC)   Type 2 diabetes mellitus with complication (HCC)   SECONDARY DIAGNOSIS:   Past Medical History:  Diagnosis Date  . Diabetes mellitus without complication (HCC)   . Hypertension   . Stroke [facility])     HOSPITAL COURSE:   76 year old female with diabetes largely housebound and total care by sons who presented with status epilepticus  1. Status epilepticus: Patient presented to the ER with ongoing seizures. She received Ativan and Keppra by the ER physician. She was initially admitted to the ICU due to status epilepticus. She was evaluated by neurology. She was continued on Keppra. She had no seizures during this hospital stay. She will continue on Keppra and have neurology follow-up in 2 weeks. She underwent MRI of the brain which did not show acute pathology. She also had EEG.  2. Acute metabolic encephalopathy in the setting of status epilepticus: Patient's mental status is slowly improving. She may have suffered some injury from prolonged seizure. This was discussed with the patient's sons who are so her caretakers.  3. Type 2 diabetes with severe hyperglycemia on presentation: Her appetite is still poor. She will be discharged on Januvia. Her other diabetic medications on hold for now. This may be restarted by her PCP if indicated.   DISCHARGE CONDITIONS AND DIET:   Stable for discharge on dysphagia 1  diet  CONSULTS OBTAINED:  Treatment Team:  Kym Groom, MD  DRUG ALLERGIES:  No Known Allergies  DISCHARGE MEDICATIONS:   Current Discharge Medication List    START taking these medications   Details  levETIRAcetam (KEPPRA) 250 MG tablet Take 1 tablet (250 mg total) by mouth 2 (two) times daily. Qty: 60 tablet, Refills: 0      CONTINUE these medications which have NOT CHANGED   Details  lisinopril (PRINIVIL,ZESTRIL) 10 MG tablet Take 10 mg by mouth daily.    sitaGLIPtin (JANUVIA) 100 MG tablet Take 100 mg by mouth daily.      STOP taking these medications     glipiZIDE (GLUCOTROL) 5 MG tablet      metFORMIN (GLUCOPHAGE) 1000 MG tablet           Today   CHIEF COMPLAINT:   No seizures overnight son at bedside   VITAL SIGNS:  Blood pressure 139/64, pulse 98, temperature 98.2 F (36.8 C), temperature source Oral, resp. rate 20, height  (1.499 m), weight 34.7 kg (76 lb 6.4 oz), SpO2 96 %.   REVIEW OF SYSTEMS:  Review of Systems  Unable to perform ROS: Other   Acuity of condition  Possible long term effect from prolonged seizure  PHYSICAL EXAMINATION:  GENERAL:  77 y.o.-year-old patient lying in the bed with no acute distress.  NECK:  Supple, no jugular venous distention. No thyroid enlargement, no tenderness.  LUNGS: Normal breath sounds bilaterally, no wheezing, rales,rhonchi  No use of accessory muscles of respiration.  CARDIOVASCULAR: S1, S2 normal. No murmurs, rubs,  or gallops.  ABDOMEN: Soft, non-tender, non-distended. Bowel sounds present. No organomegaly or mass.  EXTREMITIES: No pedal edema, cyanosis, or clubbing.  PSYCHIATRIC: The patient is alert But does not talk very much  Her speech is slow  SKIN: No obvious rash, lesion, or ulcer.  She is bedbound  DATA REVIEW:   CBC  Recent Labs Lab 01/05/17 0419  WBC 10.8  HGB 10.1*  HCT 30.1*  PLT 110*    Chemistries   Recent Labs Lab 01/01/17 0845  01/04/17 0411  01/04/17 2200  NA 137  < > 143  --   K 4.9  < > 4.1  --   CL 105  < > 117*  --   CO2 20*  < > 20*  --   GLUCOSE 718*  < > 234* 509*  BUN 66*  < > 38*  --   CREATININE 2.55*  < > 1.75*  --   CALCIUM 9.3  < > 8.7*  --   AST 32  --   --   --   ALT 40  --   --   --   ALKPHOS 95  --   --   --   BILITOT 0.5  --   --   --   < > = values in this interval not displayed.  Cardiac Enzymes  Recent Labs Lab 01/01/17 0845  TROPONINI 0.04*    Microbiology Results  @  RADIOLOGY:  Mr Brain Wo Contrast  Result Date: 01/04/2017 CLINICAL DATA:  Found seizing this morning. Diminished mental status. EXAM: MRI HEAD WITHOUT CONTRAST TECHNIQUE: Multiplanar, multiecho pulse sequences of the brain and surrounding structures were obtained without intravenous contrast. COMPARISON:  Head CT 01/02/2017 FINDINGS: Brain: The study suffers from considerable motion degradation. Diffusion imaging does not show any acute or subacute infarction, though sensitivity is diminished because of the gross motion. The brain shows generalized atrophy. There is old infarction at the left temporal tip. There is old infarction in the left occipital lobe. There are extensive chronic small vessel ischemic changes throughout the white matter. Ventricles are prominent, consistent with central atrophy. No extra-axial collection. Vascular: No vascular data given the motion degradation. Skull and upper cervical spine: No abnormality seen. Sinuses/Orbits: No abnormality seen. Other: None IMPRESSION: Severely motion degraded exam. No acute abnormality seen. Atrophy and extensive old ischemic changes. Electronically Signed   By: Paulina Fusi M.D.   On: 01/04/2017 12:37      Current Discharge Medication List    START taking these medications   Details  levETIRAcetam (KEPPRA) 250 MG tablet Take 1 tablet (250 mg total) by mouth 2 (two) times daily. Qty: 60 tablet, Refills: 0      CONTINUE these medications which have NOT  CHANGED   Details  lisinopril (PRINIVIL,ZESTRIL) 10 MG tablet Take 10 mg by mouth daily.    sitaGLIPtin (JANUVIA) 100 MG tablet Take 100 mg by mouth daily.      STOP taking these medications     glipiZIDE (GLUCOTROL) 5 MG tablet      metFORMIN (GLUCOPHAGE) 1000 MG tablet           Management plans discussed with the patient's son at bedside and he is in agreement. Stable for discharge home  Patient should follow up with neurology 2 weeks And PCP CODE STATUS:     Code Status Orders        Start     Ordered   01/01/17 1135  Do not attempt resuscitation (  DNR)  Continuous    Question Answer Comment  In the event of cardiac or respiratory ARREST Do not call a "code blue"   In the event of cardiac or respiratory ARREST Do not perform Intubation, CPR, defibrillation or ACLS   In the event of cardiac or respiratory ARREST Use medication by any route, position, wound care, and other measures to relive pain and suffering. May use oxygen, suction and manual treatment of airway obstruction as needed for comfort.      01/01/17 1136    Code Status History    Date Active Date Inactive Code Status Order ID Comments User Context   This patient has a current code status but no historical code status.      TOTAL TIME TAKING CARE OF THIS PATIENT: 38 minutes.    Note: This dictation was prepared with Dragon dictation along with smaller phrase technology. Any transcriptional errors that result from this process are unintentional.  Alder Murri M.D on 01/05/2017 at 9:31 AM  Between 7am to 6pm - Pager - [phone] After 6pm go to [email or web address] - Social research officer, government  [facility] [place] [facility]  Office  [phone]  CC: Primary care physician; No PCP Per Patient

## 2017-02-05 DEATH — deceased
